# Patient Record
Sex: Female | Born: 1970 | Hispanic: Yes | Marital: Married | Smoking: Never smoker
Health system: Southern US, Community
[De-identification: ages and names within clinical notes are randomized; demographics above are authoritative.]

## PROBLEM LIST (undated history)

## (undated) DIAGNOSIS — N888 Other specified noninflammatory disorders of cervix uteri: Secondary | ICD-10-CM

## (undated) DIAGNOSIS — N950 Postmenopausal bleeding: Secondary | ICD-10-CM

## (undated) DIAGNOSIS — D509 Iron deficiency anemia, unspecified: Secondary | ICD-10-CM

## (undated) DIAGNOSIS — N946 Dysmenorrhea, unspecified: Secondary | ICD-10-CM

## (undated) HISTORY — PX: WISDOM TOOTH EXTRACTION: SHX21

---

## 2006-02-21 ENCOUNTER — Emergency Department: Payer: Self-pay | Admitting: Emergency Medicine

## 2006-02-27 ENCOUNTER — Emergency Department: Payer: Self-pay | Admitting: Emergency Medicine

## 2011-01-02 ENCOUNTER — Ambulatory Visit: Payer: Self-pay | Admitting: Family

## 2013-05-24 ENCOUNTER — Ambulatory Visit: Payer: Self-pay | Admitting: Family Medicine

## 2013-11-08 ENCOUNTER — Ambulatory Visit: Payer: Self-pay | Admitting: Internal Medicine

## 2013-12-07 ENCOUNTER — Ambulatory Visit: Payer: Self-pay | Admitting: Internal Medicine

## 2014-03-13 ENCOUNTER — Emergency Department: Payer: Self-pay | Admitting: Internal Medicine

## 2014-03-13 LAB — BASIC METABOLIC PANEL
ANION GAP: 5 — AB (ref 7–16)
BUN: 20 mg/dL — ABNORMAL HIGH (ref 7–18)
CO2: 26 mmol/L (ref 21–32)
Calcium, Total: 8.3 mg/dL — ABNORMAL LOW (ref 8.5–10.1)
Chloride: 102 mmol/L (ref 98–107)
Creatinine: 0.58 mg/dL — ABNORMAL LOW (ref 0.60–1.30)
EGFR (African American): >60
EGFR (Non-African Amer.): >60
GLUCOSE: 129 mg/dL — AB (ref 65–99)
Osmolality: 271 (ref 275–301)
Potassium: 3.6 mmol/L (ref 3.5–5.1)
Sodium: 133 mmol/L — ABNORMAL LOW (ref 136–145)

## 2014-03-13 LAB — CBC
HCT: 40.5 % (ref 35.0–47.0)
HGB: 13.3 g/dL (ref 12.0–16.0)
MCH: 28 pg (ref 26.0–34.0)
MCHC: 32.8 g/dL (ref 32.0–36.0)
MCV: 85 fL (ref 80–100)
Platelet: 294 10*3/uL (ref 150–440)
RBC: 4.75 10*6/uL (ref 3.80–5.20)
RDW: 13.6 % (ref 11.5–14.5)
WBC: 12.5 10*3/uL — ABNORMAL HIGH (ref 3.6–11.0)

## 2014-03-13 LAB — TROPONIN I: Troponin-I: <0.02 ng/mL

## 2015-02-22 ENCOUNTER — Ambulatory Visit: Payer: Self-pay | Admitting: Internal Medicine

## 2017-01-27 ENCOUNTER — Other Ambulatory Visit: Payer: Self-pay | Admitting: Internal Medicine

## 2017-01-27 DIAGNOSIS — Z1231 Encounter for screening mammogram for malignant neoplasm of breast: Secondary | ICD-10-CM

## 2017-02-28 ENCOUNTER — Ambulatory Visit: Payer: Self-pay

## 2017-03-17 ENCOUNTER — Encounter (HOSPITAL_COMMUNITY): Payer: Self-pay

## 2017-03-17 ENCOUNTER — Ambulatory Visit
Admission: RE | Admit: 2017-03-17 | Discharge: 2017-03-17 | Disposition: A | Discharge status: Home / Self Care | Payer: Managed Care, Other (non HMO) | Source: Ambulatory Visit | Attending: Internal Medicine | Admitting: Internal Medicine

## 2017-03-17 DIAGNOSIS — Z1231 Encounter for screening mammogram for malignant neoplasm of breast: Secondary | ICD-10-CM | POA: Insufficient documentation

## 2018-01-28 ENCOUNTER — Other Ambulatory Visit: Payer: Self-pay | Admitting: Internal Medicine

## 2018-01-28 DIAGNOSIS — Z1231 Encounter for screening mammogram for malignant neoplasm of breast: Secondary | ICD-10-CM

## 2018-04-16 ENCOUNTER — Ambulatory Visit
Admission: RE | Admit: 2018-04-16 | Discharge: 2018-04-16 | Disposition: A | Discharge status: Home / Self Care | Payer: Managed Care, Other (non HMO) | Source: Ambulatory Visit | Attending: Internal Medicine | Admitting: Internal Medicine

## 2018-04-16 DIAGNOSIS — Z1231 Encounter for screening mammogram for malignant neoplasm of breast: Secondary | ICD-10-CM | POA: Diagnosis present

## 2019-02-03 ENCOUNTER — Other Ambulatory Visit: Payer: Self-pay | Admitting: Internal Medicine

## 2019-02-03 DIAGNOSIS — Z1231 Encounter for screening mammogram for malignant neoplasm of breast: Secondary | ICD-10-CM

## 2019-06-15 ENCOUNTER — Other Ambulatory Visit: Payer: Self-pay | Admitting: Internal Medicine

## 2019-06-15 DIAGNOSIS — Z1231 Encounter for screening mammogram for malignant neoplasm of breast: Secondary | ICD-10-CM

## 2019-08-04 ENCOUNTER — Other Ambulatory Visit: Payer: Self-pay | Admitting: Internal Medicine

## 2019-08-04 DIAGNOSIS — Z20822 Contact with and (suspected) exposure to covid-19: Secondary | ICD-10-CM

## 2019-08-05 LAB — NOVEL CORONAVIRUS, NAA: SARS-CoV-2, NAA: DETECTED — AB

## 2019-10-20 ENCOUNTER — Ambulatory Visit
Admission: RE | Admit: 2019-10-20 | Discharge: 2019-10-20 | Disposition: A | Discharge status: Home / Self Care | Payer: PRIVATE HEALTH INSURANCE | Source: Ambulatory Visit | Attending: Internal Medicine | Admitting: Internal Medicine

## 2019-10-20 ENCOUNTER — Other Ambulatory Visit: Payer: Self-pay

## 2019-10-20 DIAGNOSIS — Z1231 Encounter for screening mammogram for malignant neoplasm of breast: Secondary | ICD-10-CM | POA: Diagnosis not present

## 2020-11-01 ENCOUNTER — Other Ambulatory Visit: Payer: Self-pay | Admitting: Internal Medicine

## 2020-11-01 DIAGNOSIS — Z1231 Encounter for screening mammogram for malignant neoplasm of breast: Secondary | ICD-10-CM

## 2020-12-05 ENCOUNTER — Ambulatory Visit
Admission: RE | Admit: 2020-12-05 | Discharge: 2020-12-05 | Disposition: A | Discharge status: Home / Self Care | Payer: PRIVATE HEALTH INSURANCE | Source: Ambulatory Visit | Attending: Internal Medicine | Admitting: Internal Medicine

## 2020-12-05 ENCOUNTER — Other Ambulatory Visit: Payer: Self-pay

## 2020-12-05 DIAGNOSIS — Z1231 Encounter for screening mammogram for malignant neoplasm of breast: Secondary | ICD-10-CM | POA: Diagnosis present

## 2021-03-01 IMAGING — MG DIGITAL SCREENING BILAT W/ TOMO W/ CAD
8 series · 8 of 24 positions shown · non-contrast
Comparison: Previous exam(s).

CLINICAL DATA: Screening.

EXAM:
DIGITAL SCREENING BILATERAL MAMMOGRAM WITH TOMO AND CAD

[L MLO synth-2D]
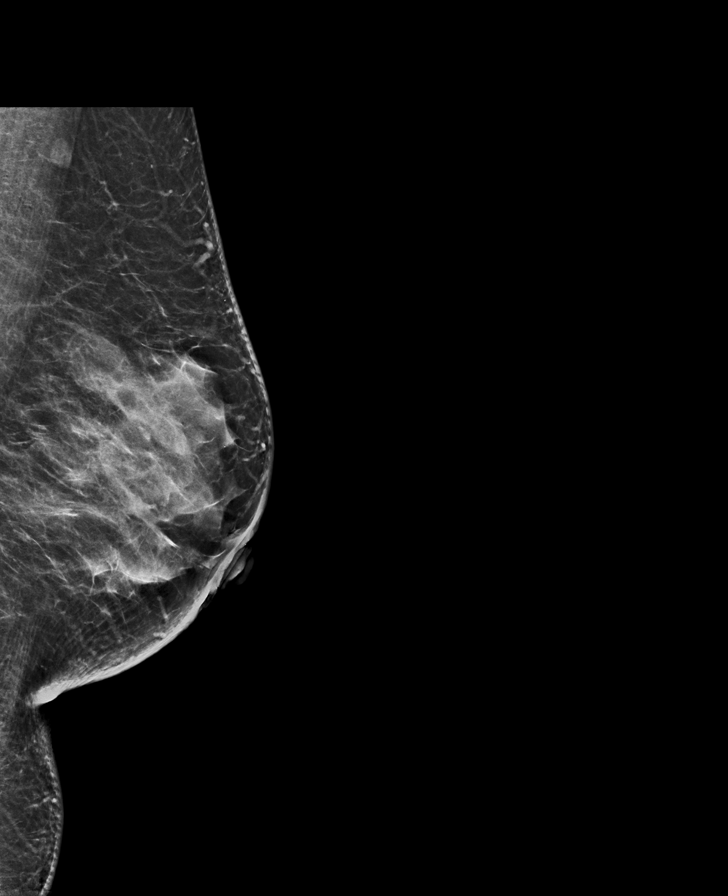

[L CC synth-2D]
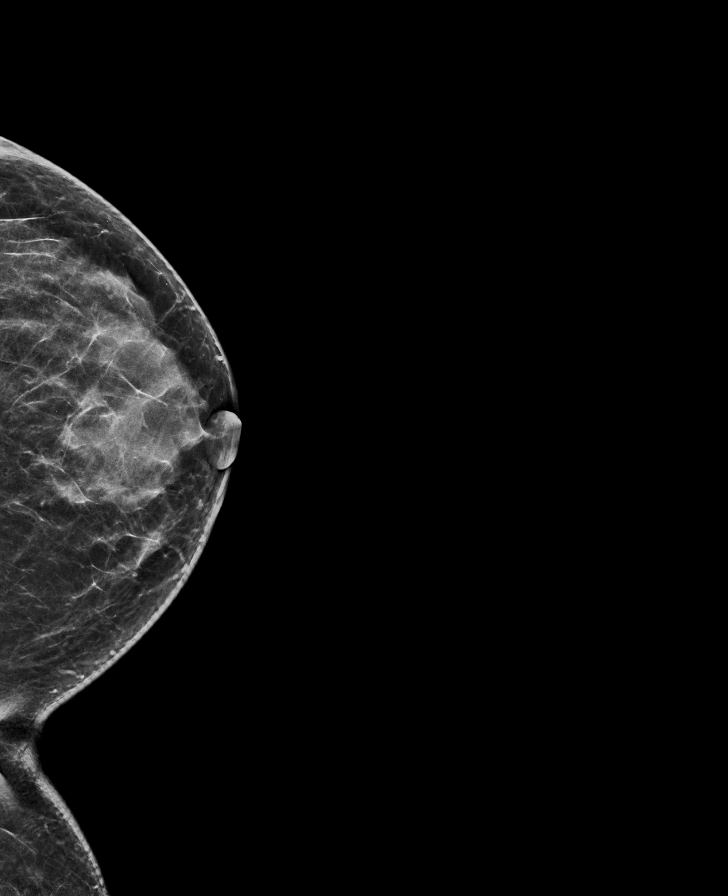

[R CC synth-2D]
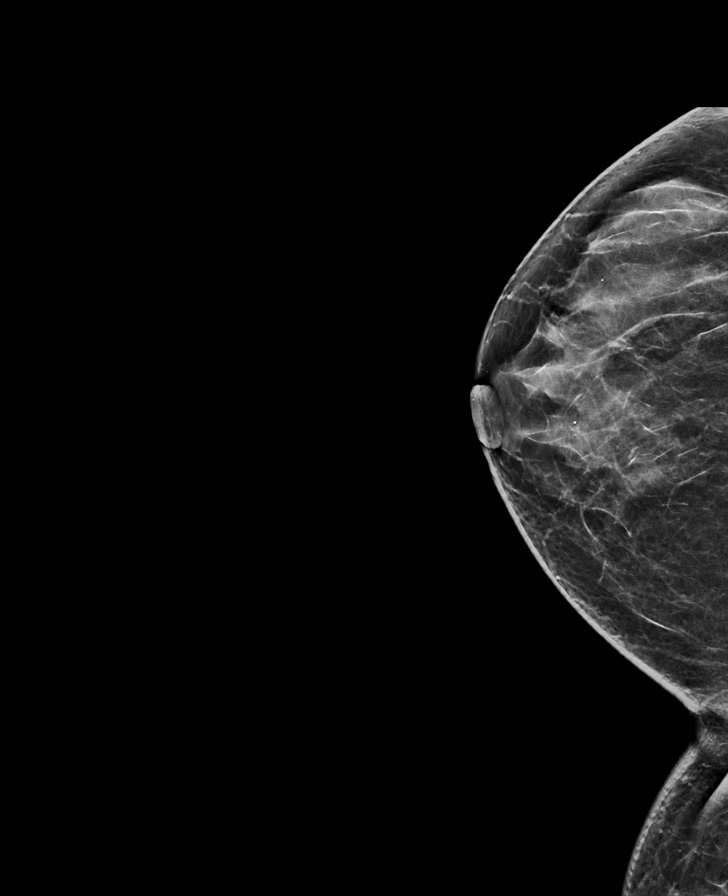

[R MLO synth-2D]
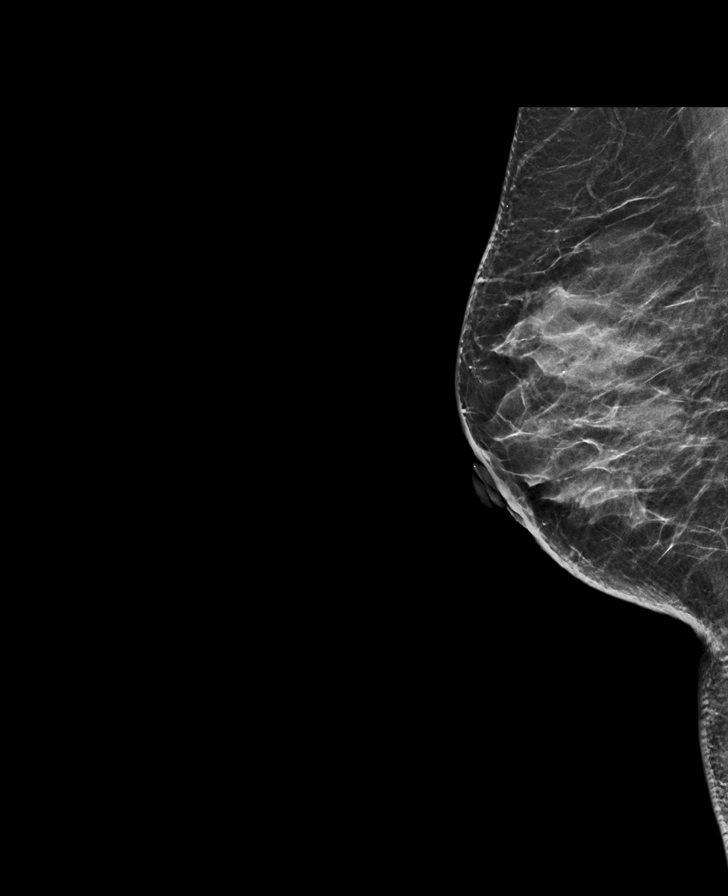

[L MLO tomo · tomo slice 34/67.0]
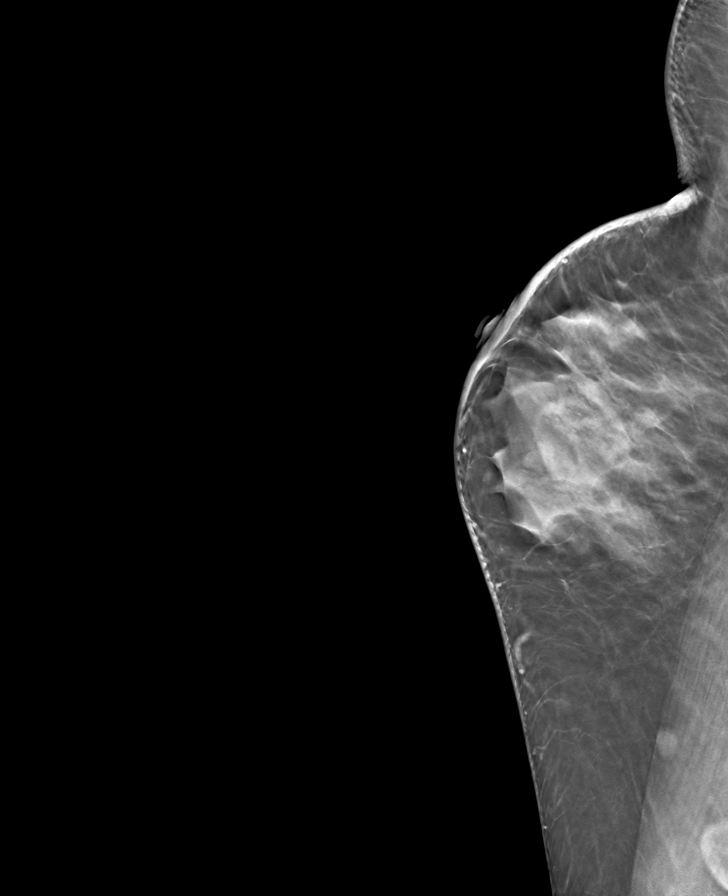

[L CC tomo · tomo slice 31/61.0]
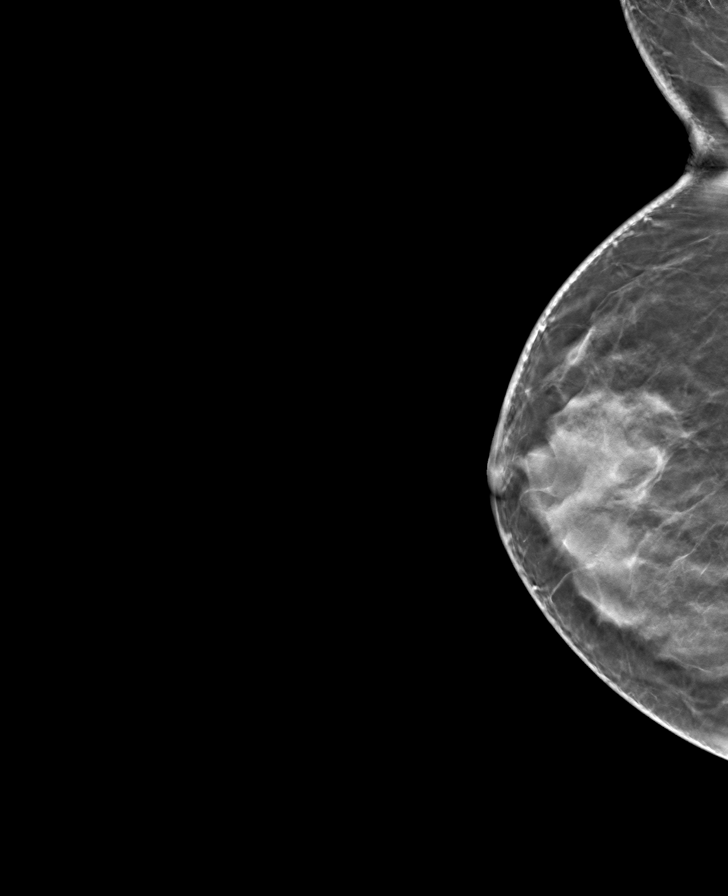

[R MLO tomo · tomo slice 31/61.0]
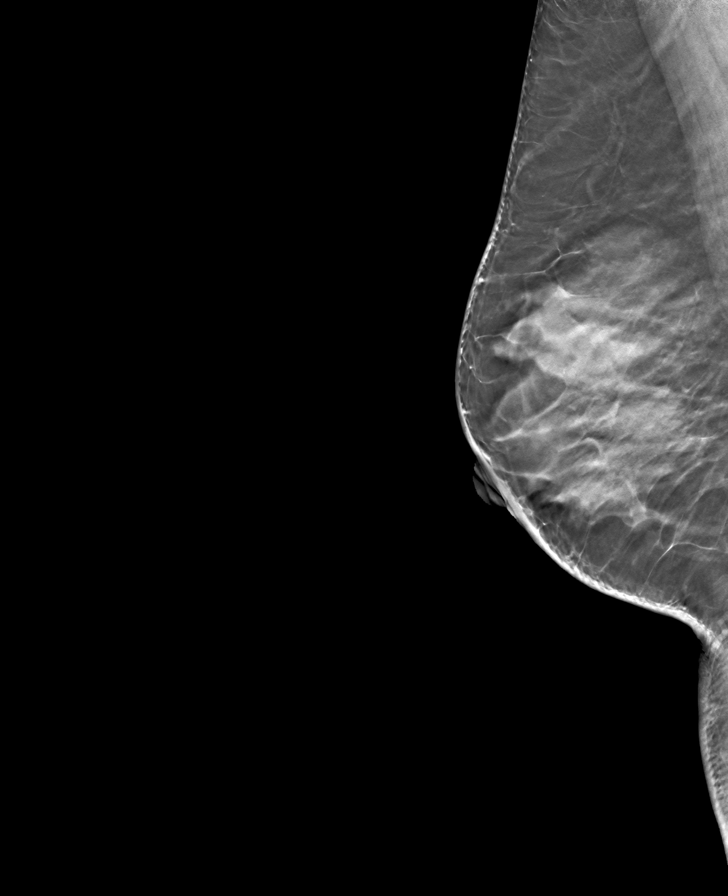

[R CC tomo · tomo slice 31/62.0]
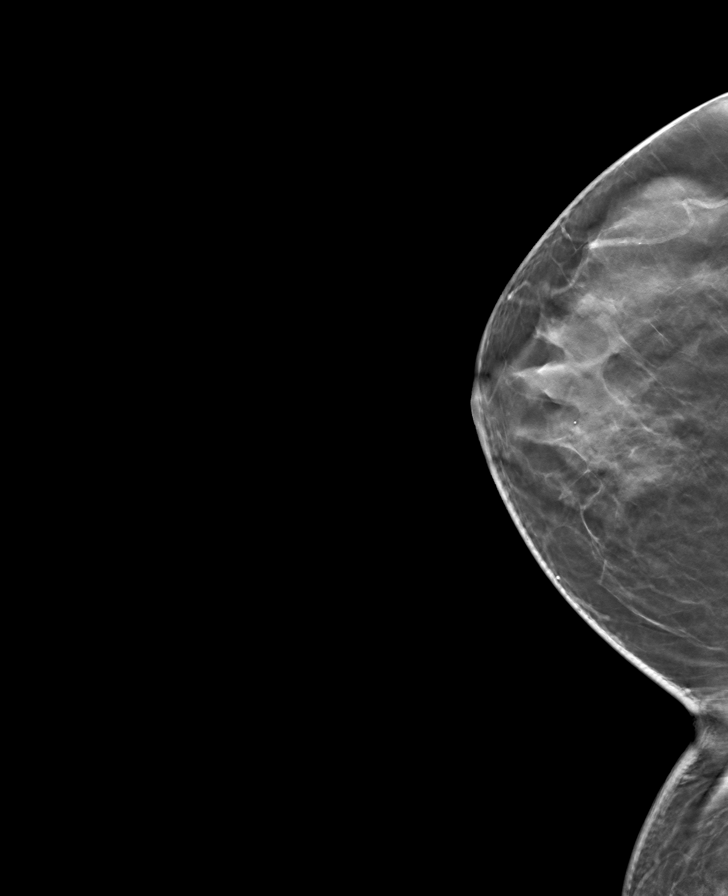

[8 of 24 positions shown; findings below may reference images not displayed]

ACR Breast Density Category c: The breast tissue is heterogeneously
dense, which may obscure small masses.
FINDINGS: There are no findings suspicious for malignancy. Images were
processed with CAD.
IMPRESSION: No mammographic evidence of malignancy. A result letter of this
screening mammogram will be mailed directly to the patient.

RECOMMENDATION:
Screening mammogram in one year. (Code:FT-U-LHB)

BI-RADS CATEGORY  1: Negative.

## 2021-04-06 ENCOUNTER — Ambulatory Visit: Admit: 2021-04-06 | Payer: No Typology Code available for payment source

## 2021-04-06 SURGERY — COLONOSCOPY WITH PROPOFOL
Anesthesia: General

## 2021-08-03 ENCOUNTER — Encounter: Payer: Self-pay | Admitting: *Deleted

## 2021-08-03 ENCOUNTER — Ambulatory Visit: Payer: PRIVATE HEALTH INSURANCE | Admitting: Anesthesiology

## 2021-08-03 ENCOUNTER — Encounter
Admission: RE | Disposition: A | Discharge status: Home / Self Care | Payer: Self-pay | Source: Home / Self Care | Attending: Gastroenterology

## 2021-08-03 ENCOUNTER — Ambulatory Visit
Admission: RE | Admit: 2021-08-03 | Discharge: 2021-08-03 | Disposition: A | Discharge status: Home / Self Care | Payer: PRIVATE HEALTH INSURANCE | Attending: Gastroenterology | Admitting: Gastroenterology

## 2021-08-03 DIAGNOSIS — Z1211 Encounter for screening for malignant neoplasm of colon: Secondary | ICD-10-CM | POA: Diagnosis not present

## 2021-08-03 DIAGNOSIS — D123 Benign neoplasm of transverse colon: Secondary | ICD-10-CM | POA: Insufficient documentation

## 2021-08-03 DIAGNOSIS — K640 First degree hemorrhoids: Secondary | ICD-10-CM | POA: Diagnosis not present

## 2021-08-03 HISTORY — PX: COLONOSCOPY WITH PROPOFOL: SHX5780

## 2021-08-03 HISTORY — DX: Dysmenorrhea, unspecified: N94.6

## 2021-08-03 HISTORY — DX: Iron deficiency anemia, unspecified: D50.9

## 2021-08-03 SURGERY — COLONOSCOPY WITH PROPOFOL
Anesthesia: General

## 2021-08-03 MED ORDER — PROPOFOL 500 MG/50ML IV EMUL
INTRAVENOUS | Status: DC | PRN
  Administered 2021-08-03: 175 ug/kg/min via INTRAVENOUS

## 2021-08-03 MED ORDER — SODIUM CHLORIDE 0.9 % IV SOLN
INTRAVENOUS | Status: DC
  Administered 2021-08-03: 1000 mL via INTRAVENOUS

## 2021-08-03 MED ORDER — PROPOFOL 500 MG/50ML IV EMUL
INTRAVENOUS | Status: AC
  Filled 2021-08-03: qty 50

## 2021-08-03 MED ORDER — PROPOFOL 10 MG/ML IV BOLUS
INTRAVENOUS | Status: DC | PRN
  Administered 2021-08-03: 70 mg via INTRAVENOUS

## 2021-08-03 MED ORDER — LIDOCAINE HCL (CARDIAC) PF 100 MG/5ML IV SOSY
PREFILLED_SYRINGE | INTRAVENOUS | Status: DC | PRN
  Administered 2021-08-03: 50 mg via INTRAVENOUS

## 2021-08-03 NOTE — Interval H&P Note (Signed)
History and Physical Interval Note:  08/03/2021 8:58 AM  Christy Wheeler  has presented today for surgery, with the diagnosis of SCREENING.  The various methods of treatment have been discussed with the patient and family. After consideration of risks, benefits and other options for treatment, the patient has consented to  Procedure(s) with comments: COLONOSCOPY WITH PROPOFOL (N/A) - SPANISH INTERPRETER as a surgical intervention.  The patient's history has been reviewed, patient examined, no change in status, stable for surgery.  I have reviewed the patient's chart and labs.  Questions were answered to the patient's satisfaction.     Lesly Rubenstein  Ok to proceed with colonoscopy

## 2021-08-03 NOTE — H&P (Signed)
Outpatient short stay form Pre-procedure 08/03/2021  Lesly Rubenstein, MD  Primary Physician: Baxter Hire, MD  Reason for visit:  Screening colonoscopy  History of present illness:   50 y/o lady with no significant PMH here for index screening colonoscopy. No blood thinners. No abdominal surgeries. No family history of GI malignancies.    Current Facility-Administered Medications:    0.9 %  sodium chloride infusion, , Intravenous, Continuous, Jamonte Curfman, Hilton Cork, MD, Last Rate: 20 mL/hr at 08/03/21 0846, Restarted at 08/03/21 0855  No medications prior to admission.     No Known Allergies   Past Medical History:  Diagnosis Date   Dysmenorrhea    IDA (iron deficiency anemia)     Review of systems:  Otherwise negative.    Physical Exam  Gen: Alert, oriented. Appears stated age.  HEENT: PERRLA. Lungs: No respiratory distress CV: RRR Abd: soft, benign, no masses. Ext: No edema    Planned procedures: Proceed with colonoscopy. The patient understands the nature of the planned procedure, indications, risks, alternatives and potential complications including but not limited to bleeding, infection, perforation, damage to internal organs and possible oversedation/side effects from anesthesia. The patient agrees and gives consent to proceed.  Please refer to procedure notes for findings, recommendations and patient disposition/instructions.     Lesly Rubenstein, MD [facility]

## 2021-08-03 NOTE — Anesthesia Postprocedure Evaluation (Signed)
Anesthesia Post Note  Patient: Taleeya Hamlyn  Procedure(s) Performed: COLONOSCOPY WITH PROPOFOL  Patient location during evaluation: Endoscopy Anesthesia Type: General Level of consciousness: awake and alert Pain management: pain level controlled Vital Signs Assessment: post-procedure vital signs reviewed and stable Respiratory status: spontaneous breathing, nonlabored ventilation, respiratory function stable and patient connected to nasal cannula oxygen Cardiovascular status: blood pressure returned to baseline and stable Postop Assessment: no apparent nausea or vomiting Anesthetic complications: no   No notable events documented.   Last Vitals:  Vitals:   08/03/21 0935 08/03/21 0945  BP: 120/87 131/82  Pulse: 83 66  Resp: 18 12  Temp:    SpO2: 100% 100%    Last Pain:  Vitals:   08/03/21 0945  TempSrc:   PainSc: 0-No pain                 Precious Haws Miguel Christiana

## 2021-08-03 NOTE — Op Note (Signed)
[facility] Patient Name: Christy Wheeler Procedure Date: 08/03/2021 9:00 AM MRN: [id number] Account #: [id number] Date of Birth: 09/03/1971 Admit Type: Outpatient Age: 50 Room: [facility] ENDO ROOM 3 Gender: Female Note Status: Finalized Procedure:             Colonoscopy Indications:           Screening for colorectal malignant neoplasm Providers:             Andrey Farmer MD, MD Referring MD:          No Local Md, MD (Referring MD) Medicines:             Monitored Anesthesia Care Complications:         No immediate complications. Estimated blood loss:                         Minimal. Procedure:             Pre-Anesthesia Assessment:                        - Prior to the procedure, a History and Physical was                         performed, and patient medications and allergies were                         reviewed. The patient is competent. The risks and                         benefits of the procedure and the sedation options and                         risks were discussed with the patient. All questions                         were answered and informed consent was obtained.                         Patient identification and proposed procedure were                         verified by the physician, the nurse, the anesthetist                         and the technician in the endoscopy suite. Mental                         Status Examination: alert and oriented. Airway                         Examination: normal oropharyngeal airway and neck                         mobility. Respiratory Examination: clear to                         auscultation. CV Examination: normal. Prophylactic  Antibiotics: The patient does not require prophylactic                         antibiotics. Prior Anticoagulants: The patient has                         taken no previous anticoagulant or antiplatelet                         agents.  ASA Grade Assessment: I - A normal, healthy                         patient. After reviewing the risks and benefits, the                         patient was deemed in satisfactory condition to                         undergo the procedure. The anesthesia plan was to use                         monitored anesthesia care (MAC). Immediately prior to                         administration of medications, the patient was                         re-assessed for adequacy to receive sedatives. The                         heart rate, respiratory rate, oxygen saturations,                         blood pressure, adequacy of pulmonary ventilation, and                         response to care were monitored throughout the                         procedure. The physical status of the patient was                         re-assessed after the procedure.                        After obtaining informed consent, the colonoscope was                         passed under direct vision. Throughout the procedure,                         the patient's blood pressure, pulse, and oxygen                         saturations were monitored continuously. The                         Colonoscope was introduced through the anus and  advanced to the the cecum, identified by appendiceal                         orifice and ileocecal valve. The colonoscopy was                         performed without difficulty. The patient tolerated                         the procedure well. The quality of the bowel                         preparation was good. Findings:      The perianal and digital rectal examinations were normal.      A 1 mm polyp was found in the splenic flexure. The polyp was sessile.       The polyp was removed with a jumbo cold forceps. Resection and retrieval       were complete. Estimated blood loss was minimal.      Internal hemorrhoids were found during retroflexion. The hemorrhoids        were Grade I (internal hemorrhoids that do not prolapse).      The exam was otherwise without abnormality on direct and retroflexion       views. Impression:            - One 1 mm polyp at the splenic flexure, removed with                         a jumbo cold forceps. Resected and retrieved.                        - Internal hemorrhoids.                        - The examination was otherwise normal on direct and                         retroflexion views. Recommendation:        - Discharge patient to home.                        - Resume previous diet.                        - Continue present medications.                        - Await pathology results.                        - Repeat colonoscopy for surveillance based on                         pathology results.                        - Return to referring physician as previously                         scheduled. Procedure Code(s):     --- Professional ---  45380, Colonoscopy, flexible; with biopsy, single or                         multiple Diagnosis Code(s):     --- Professional ---                        Z12.11, Encounter for screening for malignant neoplasm                         of colon                        K63.5, Polyp of colon                        K64.0, First degree hemorrhoids CPT copyright 2019 American Medical Association. All rights reserved. The codes documented in this report are preliminary and upon coder review may  be revised to meet current compliance requirements. Andrey Farmer MD, MD 08/03/2021 9:23:31 AM Number of Addenda: 0 Note Initiated On: 08/03/2021 9:00 AM Scope Withdrawal Time: 0 hours 9 minutes 44 seconds  Total Procedure Duration: 0 hours 14 minutes 35 seconds  Estimated Blood Loss:  Estimated blood loss was minimal.      [facility]

## 2021-08-03 NOTE — Anesthesia Procedure Notes (Signed)
Date/Time: 08/03/2021 9:02 AM Performed by: Johnna Acosta, CRNA Pre-anesthesia Checklist: Patient identified, Emergency Drugs available, Suction available and Patient being monitored Patient Re-evaluated:Patient Re-evaluated prior to induction Oxygen Delivery Method: Nasal cannula Preoxygenation: Pre-oxygenation with 100% oxygen Induction Type: IV induction

## 2021-08-03 NOTE — Transfer of Care (Signed)
Immediate Anesthesia Transfer of Care Note  Patient: Christy Wheeler  Procedure(s) Performed: COLONOSCOPY WITH PROPOFOL  Patient Location: PACU  Anesthesia Type:General  Level of Consciousness: sedated  Airway & Oxygen Therapy: Patient Spontanous Breathing  Post-op Assessment: Report given to RN and Post -op Vital signs reviewed and stable  Post vital signs: Reviewed and stable  Last Vitals:  Vitals Value Taken Time  BP 103/67 08/03/21 0926  Temp 35.8 C 08/03/21 0925  Pulse 75 08/03/21 0926  Resp 18 08/03/21 0926  SpO2 98 % 08/03/21 0926  Vitals shown include unvalidated device data.  Last Pain:  Vitals:   08/03/21 0925  TempSrc: Temporal  PainSc: 0-No pain         Complications: No notable events documented.

## 2021-08-03 NOTE — Anesthesia Preprocedure Evaluation (Signed)
Anesthesia Evaluation  Patient identified by MRN, date of birth, ID band Patient awake    Reviewed: Allergy & Precautions, NPO status , Patient's Chart, lab work & pertinent test results  History of Anesthesia Complications Negative for: history of anesthetic complications  Airway Mallampati: III  TM Distance: <3 FB Neck ROM: full    Dental  (+) Chipped, Poor Dentition, Missing   Pulmonary neg pulmonary ROS, neg shortness of breath,    Pulmonary exam normal        Cardiovascular Exercise Tolerance: Good (-) angina(-) Past MI negative cardio ROS Normal cardiovascular exam     Neuro/Psych negative neurological ROS  negative psych ROS   GI/Hepatic negative GI ROS, Neg liver ROS, neg GERD  ,  Endo/Other  negative endocrine ROS  Renal/GU negative Renal ROS  negative genitourinary   Musculoskeletal   Abdominal   Peds  Hematology negative hematology ROS (+)   Anesthesia Other Findings Past Medical History: No date: Dysmenorrhea No date: IDA (iron deficiency anemia)  History reviewed. No pertinent surgical history.     Reproductive/Obstetrics negative OB ROS                             Anesthesia Physical Anesthesia Plan  ASA: 2  Anesthesia Plan: General   Post-op Pain Management:    Induction: Intravenous  PONV Risk Score and Plan: Propofol infusion and TIVA  Airway Management Planned: Natural Airway and Nasal Cannula  Additional Equipment:   Intra-op Plan:   Post-operative Plan:   Informed Consent: I have reviewed the patients History and Physical, chart, labs and discussed the procedure including the risks, benefits and alternatives for the proposed anesthesia with the patient or authorized representative who has indicated his/her understanding and acceptance.     Dental Advisory Given and Interpreter used for interveiw  Plan Discussed with: Anesthesiologist, CRNA and  Surgeon  Anesthesia Plan Comments: (Patient consented for risks of anesthesia including but not limited to:  - adverse reactions to medications - risk of airway placement if required - damage to eyes, teeth, lips or other oral mucosa - nerve damage due to positioning  - sore throat or hoarseness - Damage to heart, brain, nerves, lungs, other parts of body or loss of life  Patient voiced understanding.)        Anesthesia Quick Evaluation

## 2021-08-06 ENCOUNTER — Encounter: Payer: Self-pay | Admitting: Gastroenterology

## 2021-08-06 LAB — SURGICAL PATHOLOGY

## 2022-09-12 ENCOUNTER — Other Ambulatory Visit: Payer: Self-pay | Admitting: Internal Medicine

## 2022-09-12 DIAGNOSIS — R10821 Right upper quadrant rebound abdominal tenderness: Secondary | ICD-10-CM

## 2022-09-19 ENCOUNTER — Other Ambulatory Visit: Payer: Self-pay | Admitting: Internal Medicine

## 2022-09-19 DIAGNOSIS — Z1231 Encounter for screening mammogram for malignant neoplasm of breast: Secondary | ICD-10-CM

## 2022-09-24 ENCOUNTER — Ambulatory Visit
Admission: RE | Admit: 2022-09-24 | Discharge: 2022-09-24 | Disposition: A | Discharge status: Home / Self Care | Payer: BC Managed Care – PPO | Source: Ambulatory Visit | Attending: Internal Medicine | Admitting: Internal Medicine

## 2022-09-24 DIAGNOSIS — R10821 Right upper quadrant rebound abdominal tenderness: Secondary | ICD-10-CM

## 2022-10-16 ENCOUNTER — Ambulatory Visit
Admission: RE | Admit: 2022-10-16 | Discharge: 2022-10-16 | Disposition: A | Discharge status: Home / Self Care | Payer: BC Managed Care – PPO | Source: Ambulatory Visit | Attending: Internal Medicine | Admitting: Internal Medicine

## 2022-10-16 DIAGNOSIS — Z1231 Encounter for screening mammogram for malignant neoplasm of breast: Secondary | ICD-10-CM | POA: Insufficient documentation

## 2023-02-25 ENCOUNTER — Other Ambulatory Visit: Payer: Self-pay | Admitting: Obstetrics and Gynecology

## 2023-02-25 DIAGNOSIS — Z1231 Encounter for screening mammogram for malignant neoplasm of breast: Secondary | ICD-10-CM

## 2023-03-25 ENCOUNTER — Ambulatory Visit
Admission: RE | Admit: 2023-03-25 | Discharge: 2023-03-25 | Disposition: A | Discharge status: Home / Self Care | Payer: BC Managed Care – PPO | Source: Ambulatory Visit | Attending: Physician Assistant | Admitting: Physician Assistant

## 2023-03-25 ENCOUNTER — Other Ambulatory Visit: Payer: Self-pay | Admitting: Physician Assistant

## 2023-03-25 DIAGNOSIS — M7989 Other specified soft tissue disorders: Secondary | ICD-10-CM

## 2023-11-11 ENCOUNTER — Ambulatory Visit
Admission: RE | Admit: 2023-11-11 | Discharge: 2023-11-11 | Disposition: A | Discharge status: Home / Self Care | Payer: No Typology Code available for payment source | Source: Ambulatory Visit | Attending: Obstetrics and Gynecology | Admitting: Obstetrics and Gynecology

## 2023-11-11 DIAGNOSIS — Z1231 Encounter for screening mammogram for malignant neoplasm of breast: Secondary | ICD-10-CM | POA: Insufficient documentation

## 2023-11-28 ENCOUNTER — Other Ambulatory Visit: Payer: Self-pay | Admitting: Obstetrics and Gynecology

## 2023-11-28 DIAGNOSIS — R928 Other abnormal and inconclusive findings on diagnostic imaging of breast: Secondary | ICD-10-CM

## 2023-12-01 ENCOUNTER — Ambulatory Visit
Admission: RE | Admit: 2023-12-01 | Discharge: 2023-12-01 | Disposition: A | Discharge status: Home / Self Care | Payer: No Typology Code available for payment source | Source: Ambulatory Visit | Attending: Obstetrics and Gynecology | Admitting: Obstetrics and Gynecology

## 2023-12-01 ENCOUNTER — Inpatient Hospital Stay
Admission: RE | Admit: 2023-12-01 | Discharge: 2023-12-01 | Discharge status: Home / Self Care | Payer: No Typology Code available for payment source | Source: Ambulatory Visit | Attending: Obstetrics and Gynecology | Admitting: Obstetrics and Gynecology

## 2023-12-01 ENCOUNTER — Ambulatory Visit
Admission: RE | Admit: 2023-12-01 | Discharge: 2023-12-01 | Disposition: A | Discharge status: Home / Self Care | Payer: BC Managed Care – PPO | Source: Ambulatory Visit | Attending: Obstetrics and Gynecology | Admitting: Obstetrics and Gynecology

## 2023-12-01 DIAGNOSIS — R928 Other abnormal and inconclusive findings on diagnostic imaging of breast: Secondary | ICD-10-CM | POA: Insufficient documentation

## 2024-04-02 ENCOUNTER — Encounter: Admitting: Oncology

## 2024-04-02 ENCOUNTER — Other Ambulatory Visit

## 2024-04-09 ENCOUNTER — Encounter: Payer: Self-pay | Admitting: Oncology

## 2024-04-09 ENCOUNTER — Inpatient Hospital Stay

## 2024-04-09 ENCOUNTER — Inpatient Hospital Stay: Attending: Oncology | Admitting: Oncology

## 2024-04-09 VITALS — BP 139/75 | HR 71 | Temp 97.0°F | Resp 18 | Ht 60.0 in | Wt 163.0 lb

## 2024-04-09 DIAGNOSIS — D72829 Elevated white blood cell count, unspecified: Secondary | ICD-10-CM | POA: Diagnosis present

## 2024-04-09 DIAGNOSIS — D72828 Other elevated white blood cell count: Secondary | ICD-10-CM | POA: Diagnosis not present

## 2024-04-09 LAB — CBC WITH DIFFERENTIAL/PLATELET
Abs Immature Granulocytes: 0.05 10*3/uL (ref 0.00–0.07)
Basophils Absolute: 0 10*3/uL (ref 0.0–0.1)
Basophils Relative: 0 %
Eosinophils Absolute: 0.2 10*3/uL (ref 0.0–0.5)
Eosinophils Relative: 2 %
HCT: 34.8 % — ABNORMAL LOW (ref 36.0–46.0)
Hemoglobin: 11.5 g/dL — ABNORMAL LOW (ref 12.0–15.0)
Immature Granulocytes: 1 %
Lymphocytes Relative: 29 %
Lymphs Abs: 2.6 10*3/uL (ref 0.7–4.0)
MCH: 26.9 pg (ref 26.0–34.0)
MCHC: 33 g/dL (ref 30.0–36.0)
MCV: 81.5 fL (ref 80.0–100.0)
Monocytes Absolute: 0.6 10*3/uL (ref 0.1–1.0)
Monocytes Relative: 7 %
Neutro Abs: 5.5 10*3/uL (ref 1.7–7.7)
Neutrophils Relative %: 61 %
Platelets: 318 10*3/uL (ref 150–400)
RBC: 4.27 MIL/uL (ref 3.87–5.11)
RDW: 13.8 % (ref 11.5–15.5)
WBC: 8.9 10*3/uL (ref 4.0–10.5)
nRBC: 0 % (ref 0.0–0.2)

## 2024-04-09 NOTE — Progress Notes (Signed)
 Hematology/Oncology Consult note [facility] Telephone:([phone] [phone]  Patient Care Team: Little Riff, MD as PCP - General (Internal Medicine)   Name of the patient: Christy Wheeler  [id number]  07/28/1971    Reason for referral-leukocytosis   Referring physician-Dr. Martine Sleek  Date of visit: 04/09/24   History of presenting illness-patient is a 52 year old Hispanic female with no significant past medical history other than history of iron deficiency anemia who has been referred for leukocytosis.  CBC on 03/16/2024 showed white cell count of 16.9, H&H of 12.2/37.1 with an MCV of 81 and a platelet count of 366.  Differential mainly showed neutrophilia with ANC of 14.  Looking back at her prior CBCs patient has had intermittent leukocytosis even dating back to 2022 when her white cell count was 15 And prior to that her white count at times has been close to 12.  Differential is mainly showed neutrophilia and occasional lymphocytosis.  She has been prescribed anti-inflammatory medications including possible steroids for her right wrist pain which comes and goes.  ECOG PS- 1  Pain scale- 0   Review of systems- Review of Systems  Constitutional:  Negative for chills, fever, malaise/fatigue and weight loss.  HENT:  Negative for congestion, ear discharge and nosebleeds.   Eyes:  Negative for blurred vision.  Respiratory:  Negative for cough, hemoptysis, sputum production, shortness of breath and wheezing.   Cardiovascular:  Negative for chest pain, palpitations, orthopnea and claudication.  Gastrointestinal:  Negative for abdominal pain, blood in stool, constipation, diarrhea, heartburn, melena, nausea and vomiting.  Genitourinary:  Negative for dysuria, flank pain, frequency, hematuria and urgency.  Musculoskeletal:  Positive for joint pain. Negative for back pain and myalgias.  Skin:  Negative for rash.  Neurological:  Negative  for dizziness, tingling, focal weakness, seizures, weakness and headaches.  Endo/Heme/Allergies:  Does not bruise/bleed easily.  Psychiatric/Behavioral:  Negative for depression and suicidal ideas. The patient does not have insomnia.     No Known Allergies  There are no active problems to display for this patient.    Past Medical History:  Diagnosis Date   Dysmenorrhea    IDA (iron deficiency anemia)      Past Surgical History:  Procedure Laterality Date   COLONOSCOPY WITH PROPOFOL  N/A 08/03/2021   Procedure: COLONOSCOPY WITH PROPOFOL ;  Surgeon: Shane Darling, MD;  Location: [facility] ENDOSCOPY;  Service: Endoscopy;  Laterality: N/A;  SPANISH INTERPRETER    Social History   Socioeconomic History   Marital status: Married    Spouse name: Not on file   Number of children: Not on file   Years of education: Not on file   Highest education level: Not on file  Occupational History   Not on file  Tobacco Use   Smoking status: Never   Smokeless tobacco: Never  Vaping Use   Vaping status: Never Used  Substance and Sexual Activity   Alcohol use: Never   Drug use: Never   Sexual activity: Not on file  Other Topics Concern   Not on file  Social History Narrative   Not on file   Social Drivers of Health   Financial Resource Strain: Low Risk  (03/29/2024)   Received from [facility]   Overall Financial Resource Strain (CARDIA)    Difficulty of Paying Living Expenses: Not hard at all  Food Insecurity: No Food Insecurity (03/29/2024)   Received from [facility]   Hunger Vital Sign  Worried About Programme researcher, broadcasting/film/video in the Last Year: Never true    Ran Out of Food in the Last Year: Never true  Transportation Needs: No Transportation Needs (03/29/2024)   Received [facility] - [facility]    In the past 12 months, has lack of transportation kept you from medical appointments or from getting medications?:  No    Lack of Transportation (Non-Medical): No  Physical Activity: Not on file  Stress: Not on file  Social Connections: Not on file  Intimate Partner Violence: Not on file     Family History  Problem Relation Age of Onset   Breast cancer Neg Hx      Current Outpatient Medications:    diclofenac (VOLTAREN) 75 MG EC tablet, Take 75 mg by mouth., Disp: , Rfl:    ferrous sulfate 325 (65 FE) MG EC tablet, Take 1 tablet by mouth daily with breakfast., Disp: , Rfl:    Glucosamine-Chondroit-Vit C-Mn (GLUCOSAMINE 1500 COMPLEX PO), Take by mouth., Disp: , Rfl:    Physical exam:  Vitals:   04/09/24 1451  BP: 139/75  Pulse: 71  Resp: 18  Temp: (!) 97 F (36.1 C)  TempSrc: Tympanic  SpO2: 98%  Weight: 163 lb (73.9 kg)  Height: 5' (1.524 m)   Physical Exam Cardiovascular:     Rate and Rhythm: Normal rate and regular rhythm.     Heart sounds: Normal heart sounds.  Pulmonary:     Effort: Pulmonary effort is normal.     Breath sounds: Normal breath sounds.  Abdominal:     General: Bowel sounds are normal.     Palpations: Abdomen is soft.     Comments: No palpable splenomegaly  Lymphadenopathy:     Comments: No palpable cervical, supraclavicular, axillary or inguinal adenopathy    Skin:    General: Skin is warm and dry.  Neurological:     Mental Status: She is alert and oriented to person, place, and time.           Latest Ref Rng & Units 03/13/2014    9:44 AM  CMP  Glucose 65 - 99 mg/dL 161   BUN 7 - 18 mg/dL 20   Creatinine 0.96 - 1.30 mg/dL 0.45   Sodium 409 - 811 mmol/L 133   Potassium 3.5 - 5.1 mmol/L 3.6   Chloride 98 - 107 mmol/L 102   CO2 21 - 32 mmol/L 26   Calcium 8.5 - 10.1 mg/dL 8.3       Latest Ref Rng & Units 03/13/2014    9:44 AM  CBC  WBC 3.6 - 11.0 x10 3/mm 3 12.5   Hemoglobin 12.0 - 16.0 g/dL 91.4   Hematocrit 78.2 - 47.0 % 40.5   Platelets 150 - 440 x10 3/mm 3 294     Assessment and plan- Patient is a 53 y.o. female followed for  neutrophilia  Patient has had mild intermittent leukocytosis/neutrophilia over the years with a white cell count that fluctuates between 10-16.  Differential is mainly shown neutrophilia in the absence of other cytopenias.  I suspect this is all reactive possibly due to use of anti inflammatory agents including steroids for her wrist pain. Patient does not require a bone marrow biopsy at this time.  I am checking CBC with differential, flow cytometry and BCR-ABL FISH testing.  I will see her back in 2 weeks time to discuss the results of blood work.   Thank you for this kind  referral and the opportunity to participate in the care of this patient   Visit Diagnosis 1. Neutrophilia     Dr. Seretha Dance, MD, MPH [facility] at [facility] [phone] 04/09/2024

## 2024-04-14 ENCOUNTER — Other Ambulatory Visit: Payer: Self-pay | Admitting: Obstetrics and Gynecology

## 2024-04-14 DIAGNOSIS — N888 Other specified noninflammatory disorders of cervix uteri: Secondary | ICD-10-CM

## 2024-04-14 LAB — COMP PANEL: LEUKEMIA/LYMPHOMA

## 2024-04-15 LAB — BCR-ABL1 FISH
Cells Analyzed: 200
Cells Counted: 200

## 2024-04-23 ENCOUNTER — Encounter: Payer: Self-pay | Admitting: Oncology

## 2024-04-23 ENCOUNTER — Inpatient Hospital Stay: Attending: Oncology | Admitting: Oncology

## 2024-04-23 VITALS — BP 133/85 | HR 74 | Temp 97.6°F | Resp 19 | Wt 164.4 lb

## 2024-04-23 DIAGNOSIS — D509 Iron deficiency anemia, unspecified: Secondary | ICD-10-CM | POA: Diagnosis not present

## 2024-04-23 DIAGNOSIS — N920 Excessive and frequent menstruation with regular cycle: Secondary | ICD-10-CM | POA: Insufficient documentation

## 2024-04-23 DIAGNOSIS — D72829 Elevated white blood cell count, unspecified: Secondary | ICD-10-CM | POA: Insufficient documentation

## 2024-04-23 DIAGNOSIS — D72828 Other elevated white blood cell count: Secondary | ICD-10-CM | POA: Diagnosis not present

## 2024-04-25 NOTE — Progress Notes (Signed)
 Hematology/Oncology Consult note [facility]  Telephone:([phone] [phone]  Patient Care Team: Little Riff, MD as PCP - General (Internal Medicine)   Name of the patient: Christy Wheeler  [id number]  December 04, 1971   Date of visit: 04/25/24  Diagnosis- neutrophilia likely reactive  Chief complaint/ Reason for visit- discuss results of bloodwork  Heme/Onc history: patient is a 53 year old Hispanic female with no significant past medical history other than history of iron deficiency anemia who has been referred for leukocytosis.  CBC on 03/16/2024 showed white cell count of 16.9, H&H of 12.2/37.1 with an MCV of 81 and a platelet count of 366.  Differential mainly showed neutrophilia with ANC of 14.  Looking back at her prior CBCs patient has had intermittent leukocytosis even dating back to 2022 when her white cell count was 15 And prior to that her white count at times has been close to 12.  Differential is mainly showed neutrophilia and occasional lymphocytosis.   Results of blood work from 04/09/2024 showed CBC with a white count of 8.9, H&H of 11.5/34.8 with an MCV of 81.5 and a platelet count of 318.  Peripheral flow cytometry did not show any evidence of immunophenotypic abnormality.  BCR-ABL FISH testing was negative.   Interval history-history obtained with the help of Spanish interpreter.  No acute issues since last visit and overall patient is doing well  ECOG PS- 1 Pain scale- 0  Review of systems- Review of Systems  Constitutional:  Negative for chills, fever, malaise/fatigue and weight loss.  HENT:  Negative for congestion, ear discharge and nosebleeds.   Eyes:  Negative for blurred vision.  Respiratory:  Negative for cough, hemoptysis, sputum production, shortness of breath and wheezing.   Cardiovascular:  Negative for chest pain, palpitations, orthopnea and claudication.  Gastrointestinal:  Negative for abdominal pain, blood  in stool, constipation, diarrhea, heartburn, melena, nausea and vomiting.  Genitourinary:  Negative for dysuria, flank pain, frequency, hematuria and urgency.  Musculoskeletal:  Negative for back pain, joint pain and myalgias.  Skin:  Negative for rash.  Neurological:  Negative for dizziness, tingling, focal weakness, seizures, weakness and headaches.  Endo/Heme/Allergies:  Does not bruise/bleed easily.  Psychiatric/Behavioral:  Negative for depression and suicidal ideas. The patient does not have insomnia.       No Known Allergies   Past Medical History:  Diagnosis Date   Dysmenorrhea    IDA (iron deficiency anemia)      Past Surgical History:  Procedure Laterality Date   COLONOSCOPY WITH PROPOFOL  N/A 08/03/2021   Procedure: COLONOSCOPY WITH PROPOFOL ;  Surgeon: Shane Darling, MD;  Location: [facility] ENDOSCOPY;  Service: Endoscopy;  Laterality: N/A;  SPANISH INTERPRETER    Social History   Socioeconomic History   Marital status: Married    Spouse name: Not on file   Number of children: Not on file   Years of education: Not on file   Highest education level: Not on file  Occupational History   Not on file  Tobacco Use   Smoking status: Never   Smokeless tobacco: Never  Vaping Use   Vaping status: Never Used  Substance and Sexual Activity   Alcohol use: Never   Drug use: Never   Sexual activity: Not on file  Other Topics Concern   Not on file  Social History Narrative   Not on file   Social Drivers of Health   Financial Resource Strain: Low Risk  (03/29/2024)   Received from  [facility]   Overall Financial Resource Strain (CARDIA)    Difficulty of Paying Living Expenses: Not hard at all  Food Insecurity: No Food Insecurity (03/29/2024)   Received from [facility]   Hunger Vital Sign    Worried About Running Out of Food in the Last Year: Never true    Ran Out of Food in the Last Year: Never true  Transportation Needs: No  Transportation Needs (03/29/2024)   Received from [facility] - Transportation    In the past 12 months, has lack of transportation kept you from medical appointments or from getting medications?: No    Lack of Transportation (Non-Medical): No  Physical Activity: Not on file  Stress: Not on file  Social Connections: Not on file  Intimate Partner Violence: Not on file    Family History  Problem Relation Age of Onset   Breast cancer Neg Hx      Current Outpatient Medications:    ferrous sulfate 325 (65 FE) MG EC tablet, Take 1 tablet by mouth daily with breakfast., Disp: , Rfl:    Glucosamine-Chondroit-Vit C-Mn (GLUCOSAMINE 1500 COMPLEX PO), Take by mouth., Disp: , Rfl:    diclofenac (VOLTAREN) 75 MG EC tablet, Take 75 mg by mouth. (Patient not taking: Reported on 04/23/2024), Disp: , Rfl:   Physical exam:  Vitals:   04/23/24 1458  BP: 133/85  Pulse: 74  Resp: 19  Temp: 97.6 F (36.4 C)  SpO2: 99%  Weight: 164 lb 6.4 oz (74.6 kg)   Physical Exam Cardiovascular:     Rate and Rhythm: Normal rate and regular rhythm.     Heart sounds: Normal heart sounds.  Pulmonary:     Effort: Pulmonary effort is normal.     Breath sounds: Normal breath sounds.  Skin:    General: Skin is warm and dry.  Neurological:     Mental Status: She is alert and oriented to person, place, and time.     I have personally reviewed labs listed below:    Latest Ref Rng & Units 03/13/2014    9:44 AM  CMP  Glucose 65 - 99 mg/dL 161   BUN 7 - 18 mg/dL 20   Creatinine 0.96 - 1.30 mg/dL 0.45   Sodium 409 - 811 mmol/L 133   Potassium 3.5 - 5.1 mmol/L 3.6   Chloride 98 - 107 mmol/L 102   CO2 21 - 32 mmol/L 26   Calcium 8.5 - 10.1 mg/dL 8.3       Latest Ref Rng & Units 04/09/2024    3:21 PM  CBC  WBC 4.0 - 10.5 K/uL 8.9   Hemoglobin 12.0 - 15.0 g/dL 91.4   Hematocrit 78.2 - 46.0 % 34.8   Platelets 150 - 400 K/uL 318     Assessment and plan- Patient is a 53 y.o. female  referred for leucocytosis/ neutrophilia  Patient's white cell count fluctuates between 8-13 over the last couple of years.  Differential is mainly show neutrophilia.  Peripheral flow cytometry and BCR-ABL FISH testing was negative.  This does not require any further follow-up with hematology or bone marrow biopsy at this time.  Patient has borderline microcytic anemia possibly secondary to iron deficiency.  This was incidentally found when I had checked her CBC but I had not checked her iron studies.  Her last colonoscopy was in 2022 and was unremarkable.  Repeat colonoscopy was not recommended for 7 years.  If there is  any further worsening in her anemia her iron studies can be checked and she can be referred to GI.  No follow-up needed with me   Visit Diagnosis 1. Neutrophilia      Dr. Seretha Dance, MD, MPH [facility] at [facility] [phone] 04/25/2024 11:37 AM

## 2024-04-26 ENCOUNTER — Ambulatory Visit
Admission: RE | Admit: 2024-04-26 | Discharge: 2024-04-26 | Disposition: A | Discharge status: Home / Self Care | Source: Ambulatory Visit | Attending: Obstetrics and Gynecology | Admitting: Obstetrics and Gynecology

## 2024-04-26 DIAGNOSIS — N888 Other specified noninflammatory disorders of cervix uteri: Secondary | ICD-10-CM

## 2024-04-26 MED ORDER — GADOPICLENOL 0.5 MMOL/ML IV SOLN
7.5000 mL | Freq: Once | INTRAVENOUS | Status: AC | PRN
  Administered 2024-04-26: 7.5 mL via INTRAVENOUS

## 2024-08-13 NOTE — H&P (Signed)
 [name] is a 53 y.o. female here for TVH and BSO  .pt with PMB    Embx - atrophic . No cancer / hyperplasia  Benign endocx polyp  Pap ascus , neg HPV   Note prominent anterior cervical mass within stroma , no ulcerating , epithelium overlying normal  MRI done to further eval the cervical mass: FINDINGS:  Urinary Tract:  No abnormality visualized.   Bowel:  Unremarkable visualized pelvic bowel loops.   Vascular/Lymphatic: No pathologically enlarged lymph nodes. No  significant vascular abnormality seen.   Reproductive: Uterine adenomyosis and multiple uterine fibroids.  Endometrial canal is significantly distorted and partially effaced.  Large, well circumscribed, contrast enhancing mass which appears to  involve the endocervical canal but also protrude into the vaginal  vault measuring 4.0 x 2.6 x 3.5 cm (series 4, image 20, series 7,  image 14). Additional small nabothian cysts of the cervix. Normal  postmenopausal appearance of the ovaries.   Other:  None.   Musculoskeletal: No suspicious bone lesions identified.   IMPRESSION:  1. Large, well circumscribed, contrast enhancing mass which appears  to involve the endocervical canal but also protrude into the vaginal  vault measuring 4.0 x 2.6 x 3.5 cm. Given appearance and  particularly given the presence of multiple other uterine fibroids,  suspect that this is a large, prolapsed endocavitary fibroid or  polyp, not typical in appearance for cervical carcinoma.  2. No evidence of lymphadenopathy or metastatic disease in the  pelvis.  3. Uterine adenomyosis and multiple uterine fibroids.       Past Medical History:  has a past medical history of Dysmenorrhea.  Past Surgical History:  has a past surgical history that includes Colonoscopy (08/03/2021) and wisdom teeth removal (2022). Family History: family history includes No Known Problems in her father and mother. Social History:  reports that she has never smoked.  She has never used smokeless tobacco. She reports that she does not drink alcohol and does not use drugs. OB/GYN History:  OB History       Gravida  3   Para  3   Term  3   Preterm      AB      Living  3        SAB      IAB      Ectopic      Molar      Multiple      Live Births  3             Allergies: has no known allergies. Medications:  Current Medications    Current Outpatient Medications:    diclofenac (VOLTAREN) 75 MG EC tablet, Take 1 tablet (75 mg total) by mouth 2 (two) times daily as needed (Pain), Disp: 60 tablet, Rfl: 3   ferrous sulfate 325 (65 FE) MG EC tablet, Take 1 tablet (325 mg total) by mouth daily with breakfast, Disp: 30 tablet, Rfl: 11   GLUCOSAMINE-CONDROITIN-HERB182 ORAL, Take by mouth, Disp: , Rfl:      Review of Systems: General:                      No fatigue or weight loss Eyes:                           No vision changes Ears:  No hearing difficulty Respiratory:                No cough or shortness of breath Pulmonary:                  No asthma or shortness of breath Cardiovascular:           No chest pain, palpitations, dyspnea on exertion Gastrointestinal:          No abdominal bloating, chronic diarrhea, constipations, masses, pain or hematochezia Genitourinary:             No hematuria, dysuria, abnormal vaginal discharge, pelvic pain, Menometrorrhagia Lymphatic:                   No swollen lymph nodes Musculoskeletal:No muscle weakness Neurologic:                  No extremity weakness, syncope, seizure disorder Psychiatric:                  No history of depression, delusions or suicidal/homicidal ideation      Exam:       Vitals:    08/17/24 1619  BP: 137/79  Pulse: 78      Body mass index is 35.79 kg/m.   WDWN hispanic female  female in NAD   Lungs: CTA  CV : RRR without murmur   Breast: exam done in sitting and lying position : No dimpling or retraction, no dominant mass,  no spontaneous discharge, no axillary adenopathy Neck:  no thyromegaly Abdomen: soft , no mass, normal active bowel sounds,  non-tender, no rebound tenderness Pelvic: tanner stage 5 ,  External genitalia: vulva /labia no lesions Urethra: no prolapse Vagina: normal physiologic d/c, adequate room for TVH if need be  Cervix: 3cm prominent anterior cervical os distorting endocx canal Uterus: normal size shape and contour, non-tender Adnexa: no mass,  non-tender   Impression:    The primary encounter diagnosis was PMB (postmenopausal bleeding). A diagnosis of endoCervical mass was also pertinent to this visit. Possible prolapsing fibroid causing cervical distortion  No evidence of cancer     Plan:    Spoke to [facility] and BSO as treatment options .  They understand that BSO may not be able to be accomplished through the vaginal route  Risks discussed see [facility] notes              DEBBY CLARYCE DINSMORE, MD

## 2024-09-01 ENCOUNTER — Encounter
Admission: RE | Admit: 2024-09-01 | Discharge: 2024-09-01 | Disposition: A | Discharge status: Home / Self Care | Source: Ambulatory Visit | Attending: Obstetrics and Gynecology | Admitting: Obstetrics and Gynecology

## 2024-09-01 ENCOUNTER — Other Ambulatory Visit: Payer: Self-pay

## 2024-09-01 HISTORY — DX: Postmenopausal bleeding: N95.0

## 2024-09-01 HISTORY — DX: Other specified noninflammatory disorders of cervix uteri: N88.8

## 2024-09-01 NOTE — Patient Instructions (Addendum)
 Your procedure is scheduled on:09-09-24 Thursday Report to the Registration Desk on the 1st floor of the [facility].Then proceed to the 2nd floor surgery desk To find out your arrival time, please call [phone] between 1PM - 3PM on:09-08-24 Wednesday If your arrival time is 6:00 am, do not arrive before that time as the Medical Mall entrance doors do not open until 6:00 am.  REMEMBER: Instructions that are not followed completely may result in serious medical risk, up to and including death; or upon the discretion of your surgeon and anesthesiologist your surgery may need to be rescheduled.  Do not eat food after midnight the night before surgery.  No gum chewing or hard candies.  You may however, drink CLEAR liquids up to 2 hours before you are scheduled to arrive for your surgery. Do not drink anything within 2 hours of your scheduled arrival time.  Clear liquids include: - water  - apple juice without pulp - gatorade (not RED colors) - black coffee or tea (Do NOT add milk or creamers to the coffee or tea) Do NOT drink anything that is not on this list.  In addition, your doctor has ordered for you to drink the provided:  Ensure Pre-Surgery Clear Carbohydrate Drink  Drinking this carbohydrate drink up to two hours before surgery helps to reduce insulin resistance and improve patient outcomes. Please complete drinking 2 hours before scheduled arrival time.  One week prior to surgery:Stop NOW (09-01-24) Stop Anti-inflammatories (NSAIDS) such as Advil, Aleve, Ibuprofen, Motrin, Naproxen, Naprosyn and Aspirin based products such as Excedrin, Goody's Powder, BC Powder. Stop ANY OVER THE COUNTER supplements until after surgery (Collagen, Ferrous Sulfate, Glucosamine)  You may however, continue to take Tylenol if needed for pain up until the day of surgery.  Do NOT take any medication the day of surgery  No Alcohol for 24 hours before or after surgery.  No Smoking including  e-cigarettes for 24 hours before surgery.  No chewable tobacco products for at least 6 hours before surgery.  No nicotine patches on the day of surgery.  Do not use any recreational drugs for at least a week (preferably 2 weeks) before your surgery.  Please be advised that the combination of cocaine and anesthesia may have negative outcomes, up to and including death. If you test positive for cocaine, your surgery will be cancelled.  On the morning of surgery brush your teeth with toothpaste and water, you may rinse your mouth with mouthwash if you wish. Do not swallow any toothpaste or mouthwash.  Use CHG Soap as directed on instruction sheet.  Do not wear jewelry, make-up, hairpins, clips or nail polish.  For welded (permanent) jewelry: bracelets, anklets, waist bands, etc.  Please have this removed prior to surgery.  If it is not removed, there is a chance that hospital personnel will need to cut it off on the day of surgery.  Do not wear lotions, powders, or perfumes.   Do not shave body hair from the neck down 48 hours before surgery.  Contact lenses, hearing aids and dentures may not be worn into surgery.  Do not bring valuables to the hospital. [facility] is not responsible for any missing/lost belongings or valuables.   Notify your doctor if there is any change in your medical condition (cold, fever, infection).  Wear comfortable clothing (specific to your surgery type) to the hospital.  After surgery, you can help prevent lung complications by doing breathing exercises.  Take deep breaths and cough  every 1-2 hours. Your doctor may order a device called an Incentive Spirometer to help you take deep breaths. When coughing or sneezing, hold a pillow firmly against your incision with both hands. This is called "splinting." Doing this helps protect your incision. It also decreases belly discomfort.  If you are being admitted to the hospital overnight, leave your suitcase in  the car. After surgery it may be brought to your room.  In case of increased patient census, it may be necessary for you, the patient, to continue your postoperative care in the Same Day Surgery department.  If you are being discharged the day of surgery, you will not be allowed to drive home. You will need a responsible individual to drive you home and stay with you for 24 hours after surgery.   If you are taking public transportation, you will need to have a responsible individual with you.  Please call the Pre-admissions Testing Dept. at [phone] if you have any questions about these instructions.  Surgery Visitation Policy:  Patients having surgery or a procedure may have two visitors.  Children under the age of 65 must have an adult with them who is not the patient.  Su procedimiento est programado para el jueves 25/08/2024. Presntese en el mostrador de Tax adviser del [employer] Luego, dirjase al mostrador de [place] en [employer] Para saber su hora de llegada, llame al [phone] entre la 1:00 p. m. y las 3:00 p. m. el mircoles 24/08/2024. Si su hora de llegada es a las 6:00 a. m., no llegue antes, ya que las puertas de [place] del [address] no abren [employer] 6:00 a. m.  RECUERDE: El incumplimiento de las instrucciones puede resultar en un riesgo mdico grave, incluso la [place]; o, a discrecin de su cirujano y Scientific laboratory technician, su ciruga podra tener que reprogramarse.  No consuma alimentos despus de la medianoche anterior a la ciruga.  No mastique chicle ni caramelos duros.  Sin embargo, puede beber lquidos claros hasta 2 horas antes de su hora de llegada programada para la [place]. No beba nada dentro de las 2 horas previas a su hora de llegada programada. Los lquidos claros incluyen: - Agua - Jugo de manzana sin pulpa - Gatorade (sin colorantes ROJOS) - Caf o t negro (NO agregue leche ni cremas al caf o t). NO beba nada que  no est en esta lista.  Adems, su mdico le ha indicado que beba la bebida de carbohidratos preoperatoria Ensure Clear Carbohydrate. Beber esta bebida de carbohidratos [employer]OK horas antes de la [place] a reducir la resistencia a la insulina y a [employer] del [place]. Por favor, deje de beberla 2 horas antes de la hora de llegada programada.  Una semana antes de la ciruga: Suspenda AHORA (17/08/2024). Suspenda los antiinflamatorios (AINE) como Advil, Aleve, ibuprofeno, Motrin, naproxeno, Naprosyn y productos a base de aspirina como Excedrin, Goody's Powder y BC Powder. Suspenda cualquier suplemento de venta libre hasta despus de la ciruga (colgeno, sulfato ferroso, glucosamina).  Sin embargo, puede continuar tomando Tylenol si lo necesita para el dolor hasta el da de la [place].  NO tome ningn medicamento el da de la [place].  No consuma alcohol durante las 24 horas anteriores ni posteriores a Metallurgist.  No fume, incluidos los cigarrillos electrnicos, durante las 24 horas previas a la [place].  No consuma tabaco masticable durante al menos 6 horas antes de la [place].  No use parches  de Optometrist de la [place].  No consuma drogas recreativas durante al menos una semana (preferiblemente 2 semanas) antes de la ciruga.  Tenga en cuenta que la combinacin de cocana y anestesia puede tener consecuencias negativas, incluso la [place]. Si da positivo en la prueba de cocana, se cancelar la ciruga.  La maana de la ciruga, cepllese los dientes con pasta dental y agua; puede enjuagarse la boca con enjuague bucal si lo desea. No ingiera pasta dental ni enjuague bucal. Use el jabn CHG segn las instrucciones.  No use joyas, maquillaje, horquillas, broches ni esmalte de uas.  Para joyas soldadas (permanentes): pulseras, tobilleras, fajas, etc., quteselas antes de la ciruga. Si no se las [place], es posible que el personal del hospital tenga que cortarlas  el da de la [place].  No use lociones, polvos ni perfumes.  No se afeite el vello corporal del cuello para abajo 48 horas antes de la [place].  No se permiten lentes de contacto, audfonos ni dentaduras postizas durante la [place].  No traiga objetos de valor al hospital. [facility] no se hace responsable de la prdida de pertenencias o objetos de valor.  Informe a su mdico si nota algn cambio en su estado de salud (resfriado, fiebre, infeccin).  Lleve ropa cmoda (especfica para el tipo de [place]) al hospital.  Despus de la [place], puede ayudar a prevenir complicaciones pulmonares haciendo ejercicios de respiracin. Respire profundamente y tosa cada 1 o 2 horas. Su mdico podra recetarle un dispositivo llamado espirmetro incentivador para ayudarle a respirar profundamente. Al toser o Engineering geologist, sostenga firmemente una almohada contra la incisin con ambas manos. Esto se llama entablillado. Esto ayuda a Engineer, drilling incisin y tambin disminuye las molestias abdominales.  Si va a pasar la noche en el hospital, deje su maleta en el coche. Despus de la [place], es posible que se la lleven a su habitacin.  En caso de un aumento en el nmero de [place], podra ser necesario que usted, el [place], contine su atencin postoperatoria en el departamento de Ciruga Ambulatoria.  Si le dan de alta el da de la ciruga, no se le permitir conducir a casa. Necesitar una persona responsable que lo lleve a casa y lo acompae durante 24 horas despus de la [place].  Si utiliza el transporte pblico, Pension scheme manager una persona responsable con usted.  Si tiene [employer], llame al [employer] de Preadmisin al [phone].  Poltica de Visitas a Cirugas:  Los [employer] se sometan a bosnia and [place]zegov[place] o procedimiento pueden [employer] visitas. Los [employer] de 16 aos deben estar acompaados por un adulto que no sea [place].     Medical sales representative to address health-related social needs:  [email or web address]

## 2024-09-01 NOTE — Progress Notes (Signed)
 Completed anesthesia interview using language line with interpreter [facility] # [phone]

## 2024-09-01 NOTE — Patient Instructions (Signed)
 Preparing for Surgery with CHLORHEXIDINE GLUCONATE (CHG) Soap  Chlorhexidine Gluconate (CHG) Soap  o An antiseptic cleaner that kills germs and bonds with the skin to continue killing germs even after washing  o Used for showering the night before surgery and morning of surgery  Before surgery, you can play an important role by reducing the number of germs on your skin.  CHG (Chlorhexidine gluconate) soap is an antiseptic cleanser which kills germs and bonds with the skin to continue killing germs even after washing.  Please do not use if you have an allergy to CHG or antibacterial soaps. If your skin becomes reddened/irritated stop using the CHG.  1. Shower the NIGHT BEFORE SURGERY and the MORNING OF SURGERY with CHG soap.  2. If you choose to wash your hair, wash your hair first as usual with your normal shampoo.  3. After shampooing, rinse your hair and body thoroughly to remove the shampoo.  4. Use CHG as you would any other liquid soap. You can apply CHG directly to the skin and wash gently with a scrungie or a clean washcloth.  5. Apply the CHG soap to your body only from the neck down. Do not use on open wounds or open sores. Avoid contact with your eyes, ears, mouth, and genitals (private parts). Wash face and genitals (private parts) with your normal soap.  6. Wash thoroughly, paying special attention to the area where your surgery will be performed.  7. Thoroughly rinse your body with warm water.  8. Do not shower/wash with your normal soap after using and rinsing off the CHG soap.  9. Pat yourself dry with a clean towel.  10. Wear clean pajamas to bed the night before surgery.  12. Place clean sheets on your bed the night of your first shower and do not sleep with pets.  13. Shower again with the CHG soap on the day of surgery prior to arriving at the  hospital.  14. Do not apply any deodorants/lotions/powders.  15. Please wear clean clothes to the hospital.   Preparacin para la Cir[place] con Jabn de Gluconato de Clorhexidina (CHG)  Jabn de Gluconato de Clorhexidina (CHG)  o Un limpiador antisptico que elimina los grmenes y se adhiere a la piel para continuar eliminndolos incluso despus del lavado.  o Se usa  para ducharse la noche anterior a la ciruga y la maana siguiente.  Antes de la [place], usted puede contribuir significativamente a reducir la cantidad de grmenes en su piel.  El jabn de Gluconato de Clorhexidina (CHG) es un limpiador antisptico que elimina los grmenes y se adhiere a la piel para continuar eliminndolos incluso despus del lavado.  No lo use si tiene alergia al [employer] de Clorhexidina o a los jabones antibacterianos. Si su piel se enrojece o se irrita, suspenda el uso del Gluconato de [place].  1. Dchese la noche anterior a la [place] y la maana siguiente con jabn de Gluconato de Clorhexidina.  2. Si decide lavarse el cabello, lvelo primero como de costumbre con su champ habitual.  3. Despus de lavarse con champ, enjuague bien el cabello y el cuerpo para Risk manager.  4. Use el CHG  como cualquier otro jabn lquido. Puede aplicarlo directamente sobre la piel y lavarse suavemente con una toallita o una toalla limpia.  5. Aplique el jabn CHG en el cuerpo solo del cuello para abajo. No lo use sobre heridas o Advertising copywriter. Evite el contacto con los ojos, odos, boca y genitales (partes ntimas). Lave la cara y los genitales (partes ntimas) con su jabn habitual.  6. Lvese bien, prestando especial atencin a la zona donde se Retail buyer.  7. Enjuague bien el cuerpo con agua tibia.  8. No se duche ni se lave con su jabn habitual despus de usar y enjuagar el jabn CHG.  9. Squese con palmaditas suaves con una toalla limpia.  10. Use pijama limpia para dormir la  noche anterior a la ciruga.  12. Coloque sbanas limpias en su cama la noche de su primera ducha y no duerma con mascotas.  13. Dchese de nuevo con jabn CHG el da de la ciruga antes de llegar al hospital.  14. No se aplique desodorante, locin ni polvos.  15. Por favor, venga al hospital con ropa limpia.

## 2024-09-07 ENCOUNTER — Encounter
Admission: RE | Admit: 2024-09-07 | Discharge: 2024-09-07 | Disposition: A | Discharge status: Home / Self Care | Source: Ambulatory Visit | Attending: Obstetrics and Gynecology | Admitting: Obstetrics and Gynecology

## 2024-09-07 DIAGNOSIS — Z01812 Encounter for preprocedural laboratory examination: Secondary | ICD-10-CM | POA: Insufficient documentation

## 2024-09-07 DIAGNOSIS — N9489 Other specified conditions associated with female genital organs and menstrual cycle: Secondary | ICD-10-CM | POA: Diagnosis present

## 2024-09-07 DIAGNOSIS — Z01818 Encounter for other preprocedural examination: Secondary | ICD-10-CM

## 2024-09-07 DIAGNOSIS — N950 Postmenopausal bleeding: Secondary | ICD-10-CM | POA: Diagnosis present

## 2024-09-07 DIAGNOSIS — D259 Leiomyoma of uterus, unspecified: Secondary | ICD-10-CM | POA: Diagnosis not present

## 2024-09-07 DIAGNOSIS — N8003 Adenomyosis of the uterus: Secondary | ICD-10-CM | POA: Diagnosis not present

## 2024-09-07 LAB — BASIC METABOLIC PANEL WITH GFR
Anion gap: 11 (ref 5–15)
BUN: 11 mg/dL (ref 6–20)
CO2: 26 mmol/L (ref 22–32)
Calcium: 8.7 mg/dL — ABNORMAL LOW (ref 8.9–10.3)
Chloride: 103 mmol/L (ref 98–111)
Creatinine, Ser: 0.48 mg/dL (ref 0.44–1.00)
GFR, Estimated: >60 mL/min (ref >60)
Glucose, Bld: 114 mg/dL — ABNORMAL HIGH (ref 70–99)
Potassium: 3.9 mmol/L (ref 3.5–5.1)
Sodium: 140 mmol/L (ref 135–145)

## 2024-09-07 LAB — CBC
HCT: 37.2 % (ref 36.0–46.0)
Hemoglobin: 12.4 g/dL (ref 12.0–15.0)
MCH: 27.5 pg (ref 26.0–34.0)
MCHC: 33.3 g/dL (ref 30.0–36.0)
MCV: 82.5 fL (ref 80.0–100.0)
Platelets: 308 K/uL (ref 150–400)
RBC: 4.51 MIL/uL (ref 3.87–5.11)
RDW: 13.2 % (ref 11.5–15.5)
WBC: 10.7 K/uL — ABNORMAL HIGH (ref 4.0–10.5)
nRBC: 0 % (ref 0.0–0.2)

## 2024-09-07 LAB — TYPE AND SCREEN
ABO/RH(D): O POS
Antibody Screen: NEGATIVE

## 2024-09-09 ENCOUNTER — Encounter
Admission: RE | Disposition: A | Discharge status: Home / Self Care | Payer: Self-pay | Source: Home / Self Care | Attending: Obstetrics and Gynecology

## 2024-09-09 ENCOUNTER — Ambulatory Visit: Payer: Self-pay | Admitting: Urgent Care

## 2024-09-09 ENCOUNTER — Ambulatory Visit
Admission: RE | Admit: 2024-09-09 | Discharge: 2024-09-09 | Disposition: A | Discharge status: Home / Self Care | Attending: Obstetrics and Gynecology | Admitting: Obstetrics and Gynecology

## 2024-09-09 ENCOUNTER — Encounter: Payer: Self-pay | Admitting: Obstetrics and Gynecology

## 2024-09-09 ENCOUNTER — Other Ambulatory Visit: Payer: Self-pay

## 2024-09-09 ENCOUNTER — Ambulatory Visit: Admitting: Anesthesiology

## 2024-09-09 DIAGNOSIS — N950 Postmenopausal bleeding: Secondary | ICD-10-CM | POA: Diagnosis not present

## 2024-09-09 DIAGNOSIS — D259 Leiomyoma of uterus, unspecified: Secondary | ICD-10-CM | POA: Insufficient documentation

## 2024-09-09 DIAGNOSIS — N8003 Adenomyosis of the uterus: Secondary | ICD-10-CM | POA: Insufficient documentation

## 2024-09-09 DIAGNOSIS — Z01818 Encounter for other preprocedural examination: Secondary | ICD-10-CM

## 2024-09-09 HISTORY — PX: HYSTERECTOMY, VAGINAL, WITH SALPINGECTOMY: SHX7588

## 2024-09-09 LAB — ABO/RH: ABO/RH(D): O POS

## 2024-09-09 SURGERY — HYSTERECTOMY, VAGINAL, WITH SALPINGECTOMY
Anesthesia: General | Site: Uterus | Laterality: Bilateral

## 2024-09-09 MED ORDER — KETOROLAC TROMETHAMINE 30 MG/ML IJ SOLN
INTRAMUSCULAR | Status: DC | PRN
  Administered 2024-09-09: 30 mg via INTRAVENOUS

## 2024-09-09 MED ORDER — LACTATED RINGERS IV SOLN: INTRAVENOUS | Status: DC

## 2024-09-09 MED ORDER — OXYCODONE HCL 5 MG PO TABS
ORAL_TABLET | ORAL | Status: AC
  Filled 2024-09-09: qty 1

## 2024-09-09 MED ORDER — ACETAMINOPHEN 10 MG/ML IV SOLN: 1000.0000 mg | Freq: Once | INTRAVENOUS | Status: DC | PRN

## 2024-09-09 MED ORDER — DEXAMETHASONE SODIUM PHOSPHATE 10 MG/ML IJ SOLN
INTRAMUSCULAR | Status: DC | PRN
  Administered 2024-09-09: 10 mg via INTRAVENOUS

## 2024-09-09 MED ORDER — DEXAMETHASONE SODIUM PHOSPHATE 10 MG/ML IJ SOLN
INTRAMUSCULAR | Status: AC
Start: 2024-09-09 — End: 2024-09-09
  Filled 2024-09-09: qty 1

## 2024-09-09 MED ORDER — LIDOCAINE-EPINEPHRINE 1 %-1:100000 IJ SOLN
INTRAMUSCULAR | Status: AC
  Filled 2024-09-09: qty 1

## 2024-09-09 MED ORDER — PROPOFOL 10 MG/ML IV BOLUS
INTRAVENOUS | Status: AC
Start: 2024-09-09 — End: 2024-09-09
  Filled 2024-09-09: qty 40

## 2024-09-09 MED ORDER — DROPERIDOL 2.5 MG/ML IJ SOLN
0.6250 mg | Freq: Once | INTRAMUSCULAR | Status: AC | PRN
  Administered 2024-09-09: 0.625 mg via INTRAVENOUS

## 2024-09-09 MED ORDER — PHENYLEPHRINE 80 MCG/ML (10ML) SYRINGE FOR IV PUSH (FOR BLOOD PRESSURE SUPPORT)
PREFILLED_SYRINGE | INTRAVENOUS | Status: DC | PRN
  Administered 2024-09-09 (×2): 80 ug via INTRAVENOUS
  Administered 2024-09-09: 160 ug via INTRAVENOUS
  Administered 2024-09-09: 80 ug via INTRAVENOUS

## 2024-09-09 MED ORDER — POVIDONE-IODINE 10 % EX SWAB: 2.0000 | Freq: Once | CUTANEOUS | Status: DC

## 2024-09-09 MED ORDER — SEVOFLURANE IN SOLN
RESPIRATORY_TRACT | Status: AC
  Filled 2024-09-09: qty 250

## 2024-09-09 MED ORDER — ROCURONIUM BROMIDE 10 MG/ML (PF) SYRINGE
PREFILLED_SYRINGE | INTRAVENOUS | Status: AC
Start: 2024-09-09 — End: 2024-09-09
  Filled 2024-09-09: qty 10

## 2024-09-09 MED ORDER — FENTANYL CITRATE (PF) 100 MCG/2ML IJ SOLN
INTRAMUSCULAR | Status: DC | PRN
  Administered 2024-09-09 (×2): 50 ug via INTRAVENOUS

## 2024-09-09 MED ORDER — ACETAMINOPHEN 500 MG PO TABS
ORAL_TABLET | ORAL | Status: AC
  Filled 2024-09-09: qty 2

## 2024-09-09 MED ORDER — SUGAMMADEX SODIUM 200 MG/2ML IV SOLN
INTRAVENOUS | Status: DC | PRN
  Administered 2024-09-09: 200 mg via INTRAVENOUS

## 2024-09-09 MED ORDER — CEFAZOLIN SODIUM-DEXTROSE 2-4 GM/100ML-% IV SOLN
INTRAVENOUS | Status: AC
  Filled 2024-09-09: qty 100

## 2024-09-09 MED ORDER — MIDAZOLAM HCL 2 MG/2ML IJ SOLN
INTRAMUSCULAR | Status: AC
  Filled 2024-09-09: qty 2

## 2024-09-09 MED ORDER — DROPERIDOL 2.5 MG/ML IJ SOLN
INTRAMUSCULAR | Status: AC
  Filled 2024-09-09: qty 2

## 2024-09-09 MED ORDER — ACETAMINOPHEN 10 MG/ML IV SOLN
INTRAVENOUS | Status: DC | PRN
  Administered 2024-09-09: 1000 mg via INTRAVENOUS

## 2024-09-09 MED ORDER — ONDANSETRON HCL 4 MG/2ML IJ SOLN
INTRAMUSCULAR | Status: AC
Start: 2024-09-09 — End: 2024-09-09
  Filled 2024-09-09: qty 2

## 2024-09-09 MED ORDER — FENTANYL CITRATE (PF) 100 MCG/2ML IJ SOLN: 25.0000 ug | INTRAMUSCULAR | Status: DC | PRN

## 2024-09-09 MED ORDER — CEFAZOLIN SODIUM-DEXTROSE 2-4 GM/100ML-% IV SOLN
2.0000 g | Freq: Once | INTRAVENOUS | Status: AC
  Administered 2024-09-09: 2 g via INTRAVENOUS

## 2024-09-09 MED ORDER — LIDOCAINE HCL (CARDIAC) PF 100 MG/5ML IV SOSY
PREFILLED_SYRINGE | INTRAVENOUS | Status: DC | PRN
  Administered 2024-09-09: 60 mg via INTRAVENOUS

## 2024-09-09 MED ORDER — LIDOCAINE HCL (PF) 2 % IJ SOLN
INTRAMUSCULAR | Status: AC
  Filled 2024-09-09: qty 5

## 2024-09-09 MED ORDER — ROCURONIUM BROMIDE 100 MG/10ML IV SOLN
INTRAVENOUS | Status: DC | PRN
  Administered 2024-09-09: 50 mg via INTRAVENOUS
  Administered 2024-09-09 (×2): 20 mg via INTRAVENOUS

## 2024-09-09 MED ORDER — CHLORHEXIDINE GLUCONATE 0.12 % MT SOLN
OROMUCOSAL | Status: AC
  Filled 2024-09-09: qty 15

## 2024-09-09 MED ORDER — MIDAZOLAM HCL 2 MG/2ML IJ SOLN
INTRAMUSCULAR | Status: DC | PRN
  Administered 2024-09-09: 2 mg via INTRAVENOUS

## 2024-09-09 MED ORDER — ACETAMINOPHEN 10 MG/ML IV SOLN
INTRAVENOUS | Status: AC
  Filled 2024-09-09: qty 100

## 2024-09-09 MED ORDER — 0.9 % SODIUM CHLORIDE (POUR BTL) OPTIME
TOPICAL | Status: DC | PRN
  Administered 2024-09-09: 500 mL

## 2024-09-09 MED ORDER — CHLORHEXIDINE GLUCONATE 0.12 % MT SOLN
15.0000 mL | Freq: Once | OROMUCOSAL | Status: AC
  Administered 2024-09-09: 15 mL via OROMUCOSAL

## 2024-09-09 MED ORDER — ORAL CARE MOUTH RINSE: 15.0000 mL | Freq: Once | OROMUCOSAL | Status: AC

## 2024-09-09 MED ORDER — OXYCODONE HCL 5 MG/5ML PO SOLN: 5.0000 mg | Freq: Once | ORAL | Status: AC | PRN

## 2024-09-09 MED ORDER — ONDANSETRON HCL 4 MG/2ML IJ SOLN
INTRAMUSCULAR | Status: DC | PRN
  Administered 2024-09-09: 4 mg via INTRAVENOUS

## 2024-09-09 MED ORDER — PHENYLEPHRINE 80 MCG/ML (10ML) SYRINGE FOR IV PUSH (FOR BLOOD PRESSURE SUPPORT)
PREFILLED_SYRINGE | INTRAVENOUS | Status: AC
  Filled 2024-09-09: qty 10

## 2024-09-09 MED ORDER — ACETAMINOPHEN 500 MG PO TABS
1000.0000 mg | ORAL_TABLET | ORAL | Status: AC
  Administered 2024-09-09: 1000 mg via ORAL

## 2024-09-09 MED ORDER — FENTANYL CITRATE (PF) 100 MCG/2ML IJ SOLN
INTRAMUSCULAR | Status: AC
  Filled 2024-09-09: qty 2

## 2024-09-09 MED ORDER — LIDOCAINE-EPINEPHRINE 1 %-1:100000 IJ SOLN
INTRAMUSCULAR | Status: DC | PRN
  Administered 2024-09-09: 10 mL

## 2024-09-09 MED ORDER — PROPOFOL 10 MG/ML IV BOLUS
INTRAVENOUS | Status: DC | PRN
  Administered 2024-09-09: 150 mg via INTRAVENOUS

## 2024-09-09 MED ORDER — OXYCODONE HCL 5 MG PO TABS
5.0000 mg | ORAL_TABLET | Freq: Once | ORAL | Status: AC | PRN
  Administered 2024-09-09: 5 mg via ORAL

## 2024-09-09 SURGICAL SUPPLY — 33 items
BAG URINE DRAIN 2000ML AR STRL (UROLOGICAL SUPPLIES) IMPLANT
CATH ROBINSON RED A/P 16FR (CATHETERS) ×1 IMPLANT
CATH URTH 16FR FL 2W BLN LF (CATHETERS) ×1 IMPLANT
COVER LIGHT HANDLE STERIS (MISCELLANEOUS) ×1 IMPLANT
DRAPE PERI LITHO V/GYN (MISCELLANEOUS) ×2 IMPLANT
DRAPE SURG 17X11 SM STRL (DRAPES) ×2 IMPLANT
DRAPE UNDER BUTTOCK W/FLU (DRAPES) ×2 IMPLANT
ELECTRODE REM PT RTRN 9FT ADLT (ELECTROSURGICAL) ×2 IMPLANT
GAUZE 4X4 16PLY ~~LOC~~+RFID DBL (SPONGE) ×3 IMPLANT
GLOVE BIOGEL PI IND STRL 7.0 (GLOVE) ×1 IMPLANT
GLOVE SURG SYN 6.5 PF PI (GLOVE) ×4 IMPLANT
GLOVE SURG SYN 8.0 PF PI (GLOVE) ×2 IMPLANT
GOWN STRL REUS W/ TWL LRG LVL3 (GOWN DISPOSABLE) ×6 IMPLANT
GOWN STRL REUS W/ TWL XL LVL3 (GOWN DISPOSABLE) ×2 IMPLANT
KIT PINK PAD W/HEAD ARM REST (MISCELLANEOUS) ×2 IMPLANT
LABEL OR SOLS (LABEL) ×2 IMPLANT
MANIFOLD NEPTUNE II (INSTRUMENTS) ×2 IMPLANT
NDL HYPO 22X1.5 SAFETY MO (MISCELLANEOUS) ×1 IMPLANT
NEEDLE HYPO 22X1.5 SAFETY MO (MISCELLANEOUS) ×2 IMPLANT
NS IRRIG 1000ML POUR BTL (IV SOLUTION) ×1 IMPLANT
PACK BASIN MINOR ARMC (MISCELLANEOUS) ×2 IMPLANT
PAD OB MATERNITY 11 LF (PERSONAL CARE ITEMS) ×2 IMPLANT
PAD PREP OB/GYN DISP 24X41 (PERSONAL CARE ITEMS) ×2 IMPLANT
SOLUTION PREP PVP 2OZ (MISCELLANEOUS) ×4 IMPLANT
SURGILUBE 2OZ TUBE FLIPTOP (MISCELLANEOUS) ×2 IMPLANT
SUT PDS 2-0 27IN (SUTURE) ×1 IMPLANT
SUT VIC AB 0 CT1 27XCR 8 STRN (SUTURE) ×5 IMPLANT
SUT VIC AB 0 CT1 36 (SUTURE) ×2 IMPLANT
SUT VIC AB 2-0 SH 27XBRD (SUTURE) ×2 IMPLANT
SYR 10ML LL (SYRINGE) ×2 IMPLANT
SYR CONTROL 10ML LL (SYRINGE) ×2 IMPLANT
TRAP FLUID SMOKE EVACUATOR (MISCELLANEOUS) ×2 IMPLANT
WATER STERILE IRR 1000ML POUR (IV SOLUTION) ×1 IMPLANT

## 2024-09-09 NOTE — Transfer of Care (Signed)
 Immediate Anesthesia Transfer of Care Note  Patient: Christy Wheeler  Procedure(s) Performed: HYSTERECTOMY, VAGINAL, WITH SALPINGECTOMY (Bilateral: Uterus)  Patient Location: PACU  Anesthesia Type:General  Level of Consciousness: sedated  Airway & Oxygen Therapy: Patient Spontanous Breathing and Patient connected to face mask oxygen  Post-op Assessment: Report given to RN  Post vital signs: Reviewed and stable  Last Vitals:  Vitals Value Taken Time  BP 105/64 09/09/24 09:24  Temp    Pulse 56 09/09/24 09:26  Resp 20 09/09/24 09:26  SpO2 100 % 09/09/24 09:26  Vitals shown include unfiled device data.  Last Pain:  Vitals:   09/09/24 0622  TempSrc: Tympanic  PainSc: 0-No pain         Complications: No notable events documented.

## 2024-09-09 NOTE — Addendum Note (Signed)
 Addendum  created 09/09/24 1350 by Veronica Alm BROCKS, CRNA   Intraprocedure Meds edited

## 2024-09-09 NOTE — Op Note (Signed)
 NAME: Christy Wheeler, Christy Wheeler MEDICAL RECORD NO: [id number] ACCOUNT NO: [id number] DATE OF BIRTH: Jul 22, 1971 FACILITY: [facility] LOCATION: [facility] PHYSICIAN: Debby DOROTHA Dinsmore, MD  Operative Report   DATE OF PROCEDURE: 09/09/2024  PREOPERATIVE DIAGNOSES: 1.  Postmenopausal bleeding. 2.  Cervical mass consistent with fibroid.  POSTOPERATIVE DIAGNOSES: 1.  Postmenopausal bleeding. 2.  Cervical mass consistent with fibroid.  PROCEDURE: 1.  Total vaginal hysterectomy. 2.  Bilateral salpingectomy.  SURGEON:  Debby DOROTHA Dinsmore, MD  FIRST ASSISTANT:  Beverli Dinsmore, MD  ANESTHESIA:  General endotracheal anesthesia.  INDICATIONS:  A 53 year old female with postmenopausal bleeding.  Workup in the office entailed an endometrial biopsy, which was negative and the patient had a cervical mass that was evaluated by MRI that was consistent with probable fibroid.  DESCRIPTION OF PROCEDURE:  After adequate general endotracheal anesthesia, the patient was placed in the dorsal supine position with the legs in the candy cane stirrups.  Lower abdomen, perineum, and vagina were prepped and draped in normal sterile  fashion.  The patient did receive 2 g of IV Ancef  for surgical prophylaxis prior to commencement.  Timeout was performed.  Straight catheterization of the bladder yielded 200 mL of clear urine.  A weighted speculum was placed in the posterior vaginal  vault and the cervix was grasped with 2 thyroid tenaculum.  Asymmetric cervix with bulging of the right outer quadrant noted.  A direct posterior colpotomy incision was made upon entry into the posterior cul-de-sac.  The long-billed weighted speculum was  placed.  The uterosacral ligaments were then bilaterally clamped, transected, and suture ligated with 0 Vicryl suture, tagged for later identification.  The anterior cervix was circumferentially incised with the Bovie after injecting the cervix with 1%  lidocaine  with  1:100,000 epinephrine .  Of note, this was done before the posterior colpotomy incision.  Cardinal ligaments were then bilaterally clamped, transected, and suture ligated with 0 Vicryl suture.  The anterior cul-de-sac was then entered  sharply without difficulty.  The Deaver retractor was placed within to elevate the bladder anteriorly.  The uterine arteries were bilaterally clamped, transected, and suture ligated with 0 Vicryl suture.  Given the mass effect of the cervix, the cervix  was then amputated to aid in visualization.  2 small fibroids within the myometrium were removed during the procedure as well.  Serial clamping, cutting, and tying continued to the cornua, which were bilaterally clamped, transected, and doubly ligated  with 0 Vicryl suture.  The ovaries were high and could not be brought down far enough to identify the infundibulopelvic ligament.  Therefore, bilateral salpingectomies were performed with clamping and suture ligating with 0 Vicryl suture.  Good  hemostasis was noted.  The peritoneum was then closed with a pursestring 2-0 PDS suture.  The vaginal cuff was then closed with 0 Vicryl suture in a running nonlocking fashion.  The uterosacral ligaments were bilaterally tied together and the rest of the  cuff was closed with 0 Vicryl suture.  Good hemostasis was noted.  Straight catheterization of the bladder at the end of the procedure yielded 50 mL of clear urine.  The patient did receive 30 mg intravenous Toradol  at the end of the procedure.  ESTIMATED BLOOD LOSS:  20 mL.  INTRAOPERATIVE FLUIDS:  500 mL.  URINE OUTPUT:  250 mL. Assistant Dr Beverli Faryal Marxen was needed for the procedure for her skill in retraction and aid in dissection . DISPOSITION:  She was taken and recovery room in good condition.   PUS D:  09/09/2024 9:43:26 am T: 09/09/2024 1:13:00 pm  JOB: [id number]/ [id number]

## 2024-09-09 NOTE — Anesthesia Preprocedure Evaluation (Addendum)
 Anesthesia Evaluation  Patient identified by MRN, date of birth, ID band Patient awake    Reviewed: Allergy & Precautions, H&P , NPO status , Patient's Chart, lab work & pertinent test results  Airway Mallampati: III  TM Distance: >3 FB Neck ROM: full    Dental  (+) Missing,    Pulmonary neg pulmonary ROS   Pulmonary exam normal        Cardiovascular negative cardio ROS Normal cardiovascular exam     Neuro/Psych negative neurological ROS  negative psych ROS   GI/Hepatic negative GI ROS, Neg liver ROS,,,  Endo/Other  negative endocrine ROS    Renal/GU      Musculoskeletal   Abdominal  (+) + obese  Peds  Hematology negative hematology ROS (+)   Anesthesia Other Findings Past Medical History: No date: Cervical mass No date: Dysmenorrhea No date: IDA (iron deficiency anemia) No date: PMB (postmenopausal bleeding)  Past Surgical History: 08/03/2021: COLONOSCOPY WITH PROPOFOL ; N/A     Comment:  Procedure: COLONOSCOPY WITH PROPOFOL ;  Surgeon:               Maryruth Ole DASEN, MD;  Location: [facility];                Service: Endoscopy;  Laterality: N/A;  SPANISH               INTERPRETER No date: WISDOM TOOTH EXTRACTION  BMI    Body Mass Index: 34.84 kg/m      Reproductive/Obstetrics negative OB ROS                              Anesthesia Physical Anesthesia Plan  ASA: 2  Anesthesia Plan: General ETT   Post-op Pain Management: Tylenol  PO (pre-op)*, Ketamine IV* and Toradol  IV (intra-op)*   Induction: Intravenous  PONV Risk Score and Plan: 4 or greater and Ondansetron , Dexamethasone  and Midazolam   Airway Management Planned: Oral ETT  Additional Equipment:   Intra-op Plan:   Post-operative Plan: Extubation in OR  Informed Consent: I have reviewed the patients History and Physical, chart, labs and discussed the procedure including the risks, benefits and  alternatives for the proposed anesthesia with the patient or authorized representative who has indicated his/her understanding and acceptance.     Dental Advisory Given  Plan Discussed with: CRNA and Surgeon  Anesthesia Plan Comments:          Anesthesia Quick Evaluation

## 2024-09-09 NOTE — Anesthesia Procedure Notes (Signed)
 Procedure Name: Intubation Date/Time: 09/09/2024 7:35 AM  Performed by: Veronica Alm BROCKS, CRNAPre-anesthesia Checklist: Patient identified, Patient being monitored, Timeout performed, Emergency Drugs available and Suction available Patient Re-evaluated:Patient Re-evaluated prior to induction Oxygen Delivery Method: Circle system utilized Preoxygenation: Pre-oxygenation with 100% oxygen Induction Type: IV induction Ventilation: Mask ventilation without difficulty Laryngoscope Size: 3 and McGrath Grade View: Grade I Tube type: Oral Tube size: 7.0 mm Number of attempts: 1 Airway Equipment and Method: Stylet Placement Confirmation: ETT inserted through vocal cords under direct vision, positive ETCO2 and breath sounds checked- equal and bilateral Secured at: 19 cm Tube secured with: Tape Dental Injury: Teeth and Oropharynx as per pre-operative assessment

## 2024-09-09 NOTE — Progress Notes (Signed)
 Pt here ofr TVHand BSO . LAbs reviewed . All questions answered . Proceed

## 2024-09-09 NOTE — Anesthesia Postprocedure Evaluation (Signed)
 Anesthesia Post Note  Patient: Christy Wheeler  Procedure(s) Performed: HYSTERECTOMY, VAGINAL, WITH SALPINGECTOMY (Bilateral: Uterus)  Patient location during evaluation: PACU Anesthesia Type: General Level of consciousness: awake and alert Pain management: pain level controlled Vital Signs Assessment: post-procedure vital signs reviewed and stable Respiratory status: spontaneous breathing, nonlabored ventilation and respiratory function stable Cardiovascular status: blood pressure returned to baseline and stable Postop Assessment: no apparent nausea or vomiting Anesthetic complications: no   No notable events documented.   Last Vitals:  Vitals:   09/09/24 1010 09/09/24 1029  BP: 103/65 (P) 109/60  Pulse: 60 (!) 58  Resp: 15 18  Temp: 36.7 C   SpO2: 100% 100%    Last Pain:  Vitals:   09/09/24 1010  TempSrc:   PainSc: 5                  Camellia Merilee Louder

## 2024-09-09 NOTE — Brief Op Note (Signed)
 09/09/2024  9:15 AM  PATIENT:  Christy Wheeler  53 y.o. female  PRE-OPERATIVE DIAGNOSIS:  postmenopausal bleeding endometrial mass  POST-OPERATIVE DIAGNOSIS:  postmenopausal bleedingendometrial mass Cervical fibroid PROCEDURE:  Procedure(s): HYSTERECTOMY, VAGINAL, WITH SALPINGECTOMY (Bilateral)  SURGEON:  Surgeons and Role:    * Raizel Wesolowski, Debby PARAS, MD - Primary    * Teanna Elem, Beverli GAILS, MD - Assisting  PHYSICIAN ASSISTANT: cst  ASSISTANTS: none   ANESTHESIA:   general  EBL:  20 mL IOF 500cc uo 250 cc  BLOOD ADMINISTERED:none  DRAINS: none   LOCAL MEDICATIONS USED:  LIDOCAINE    SPECIMEN:  Source of Specimen:  cx , uterus , bilateral fallopian tubes   DISPOSITION OF SPECIMEN:  PATHOLOGY  COUNTS:  YES  TOURNIQUET:  * No tourniquets in log *  DICTATION: .Other Dictation: Dictation Number verbal  PLAN OF CARE: Discharge to home after PACU  PATIENT DISPOSITION:  PACU - hemodynamically stable.   Delay start of Pharmacological VTE agent (>24hrs) due to surgical blood loss or risk of bleeding: not applicable

## 2024-09-10 ENCOUNTER — Encounter: Payer: Self-pay | Admitting: Obstetrics and Gynecology

## 2024-09-13 LAB — SURGICAL PATHOLOGY

## 2024-09-27 ENCOUNTER — Encounter: Payer: Self-pay | Admitting: Obstetrics and Gynecology

## 2024-11-03 ENCOUNTER — Other Ambulatory Visit: Payer: Self-pay | Admitting: Internal Medicine

## 2024-11-03 DIAGNOSIS — Z1231 Encounter for screening mammogram for malignant neoplasm of breast: Secondary | ICD-10-CM

## 2024-12-10 ENCOUNTER — Ambulatory Visit
Admission: RE | Admit: 2024-12-10 | Discharge: 2024-12-10 | Disposition: A | Discharge status: Home / Self Care | Source: Ambulatory Visit | Attending: Internal Medicine | Admitting: Internal Medicine

## 2024-12-10 DIAGNOSIS — Z1231 Encounter for screening mammogram for malignant neoplasm of breast: Secondary | ICD-10-CM | POA: Diagnosis present
